# Patient Record
Sex: Female | Born: 1980 | Race: White | Hispanic: No | Marital: Single | Smoking: Former smoker
Health system: Southern US, Community
[De-identification: ages and names within clinical notes are randomized; demographics above are authoritative.]

## PROBLEM LIST (undated history)

## (undated) DIAGNOSIS — F419 Anxiety disorder, unspecified: Secondary | ICD-10-CM

## (undated) DIAGNOSIS — K5790 Diverticulosis of intestine, part unspecified, without perforation or abscess without bleeding: Secondary | ICD-10-CM

## (undated) DIAGNOSIS — K219 Gastro-esophageal reflux disease without esophagitis: Secondary | ICD-10-CM

## (undated) DIAGNOSIS — E785 Hyperlipidemia, unspecified: Secondary | ICD-10-CM

## (undated) DIAGNOSIS — F32A Depression, unspecified: Secondary | ICD-10-CM

## (undated) DIAGNOSIS — A498 Other bacterial infections of unspecified site: Secondary | ICD-10-CM

## (undated) DIAGNOSIS — G43909 Migraine, unspecified, not intractable, without status migrainosus: Secondary | ICD-10-CM

## (undated) DIAGNOSIS — A499 Bacterial infection, unspecified: Secondary | ICD-10-CM

## (undated) DIAGNOSIS — B958 Unspecified staphylococcus as the cause of diseases classified elsewhere: Secondary | ICD-10-CM

## (undated) DIAGNOSIS — J302 Other seasonal allergic rhinitis: Secondary | ICD-10-CM

## (undated) HISTORY — PX: LAPAROSCOPY: SHX197

## (undated) HISTORY — DX: Hyperlipidemia, unspecified: E78.5

## (undated) HISTORY — PX: TYMPANOSTOMY TUBE PLACEMENT: SHX32

## (undated) HISTORY — DX: Depression, unspecified: F32.A

## (undated) HISTORY — DX: Unspecified staphylococcus as the cause of diseases classified elsewhere: B95.8

## (undated) HISTORY — DX: Other bacterial infections of unspecified site: A49.8

## (undated) HISTORY — DX: Diverticulosis of intestine, part unspecified, without perforation or abscess without bleeding: K57.90

## (undated) HISTORY — PX: TONSILLECTOMY AND ADENOIDECTOMY: SHX28

## (undated) HISTORY — DX: Other seasonal allergic rhinitis: J30.2

## (undated) HISTORY — DX: Bacterial infection, unspecified: A49.9

## (undated) HISTORY — DX: Gastro-esophageal reflux disease without esophagitis: K21.9

## (undated) HISTORY — DX: Anxiety disorder, unspecified: F41.9

---

## 2006-07-01 HISTORY — PX: ABDOMINAL ADHESION SURGERY: SHX90

## 2016-11-27 ENCOUNTER — Encounter (HOSPITAL_COMMUNITY): Payer: Self-pay | Admitting: Emergency Medicine

## 2016-11-27 ENCOUNTER — Emergency Department (HOSPITAL_COMMUNITY): Payer: BLUE CROSS/BLUE SHIELD

## 2016-11-27 ENCOUNTER — Emergency Department (HOSPITAL_COMMUNITY)
Admission: EM | Admit: 2016-11-27 | Discharge: 2016-11-27 | Disposition: A | Discharge status: Home / Self Care | Payer: BLUE CROSS/BLUE SHIELD | Attending: Emergency Medicine | Admitting: Emergency Medicine

## 2016-11-27 DIAGNOSIS — Z79899 Other long term (current) drug therapy: Secondary | ICD-10-CM | POA: Diagnosis not present

## 2016-11-27 DIAGNOSIS — G43009 Migraine without aura, not intractable, without status migrainosus: Secondary | ICD-10-CM | POA: Diagnosis not present

## 2016-11-27 DIAGNOSIS — R51 Headache: Secondary | ICD-10-CM | POA: Diagnosis present

## 2016-11-27 DIAGNOSIS — J0110 Acute frontal sinusitis, unspecified: Secondary | ICD-10-CM | POA: Diagnosis not present

## 2016-11-27 HISTORY — DX: Migraine, unspecified, not intractable, without status migrainosus: G43.909

## 2016-11-27 MED ORDER — TRAMADOL HCL 50 MG PO TABS: 50.0000 mg | ORAL_TABLET | Freq: Four times a day (QID) | ORAL | 0 refills | Status: DC | PRN

## 2016-11-27 MED ORDER — AMOXICILLIN-POT CLAVULANATE 875-125 MG PO TABS: 1.0000 | ORAL_TABLET | Freq: Two times a day (BID) | ORAL | 0 refills | Status: DC

## 2016-11-27 MED ORDER — PROCHLORPERAZINE EDISYLATE 5 MG/ML IJ SOLN
10.0000 mg | Freq: Once | INTRAMUSCULAR | Status: AC
  Administered 2016-11-27: 10 mg via INTRAVENOUS
  Filled 2016-11-27: qty 2

## 2016-11-27 MED ORDER — KETOROLAC TROMETHAMINE 30 MG/ML IJ SOLN
30.0000 mg | Freq: Once | INTRAMUSCULAR | Status: AC
  Administered 2016-11-27: 30 mg via INTRAVENOUS
  Filled 2016-11-27: qty 1

## 2016-11-27 MED ORDER — FLUCONAZOLE 200 MG PO TABS: 200.0000 mg | ORAL_TABLET | Freq: Once | ORAL | 0 refills | Status: AC

## 2016-11-27 MED ORDER — DIPHENHYDRAMINE HCL 50 MG/ML IJ SOLN
25.0000 mg | Freq: Once | INTRAMUSCULAR | Status: AC
  Administered 2016-11-27: 25 mg via INTRAVENOUS
  Filled 2016-11-27: qty 1

## 2016-11-27 MED ORDER — SODIUM CHLORIDE 0.9 % IV BOLUS (SEPSIS)
1000.0000 mL | Freq: Once | INTRAVENOUS | Status: AC
  Administered 2016-11-27: 1000 mL via INTRAVENOUS

## 2016-11-27 MED ORDER — GUAIFENESIN ER 1200 MG PO TB12: 1.0000 | ORAL_TABLET | Freq: Two times a day (BID) | ORAL | 0 refills | Status: DC

## 2016-11-27 MED ORDER — IBUPROFEN 800 MG PO TABS: 800.0000 mg | ORAL_TABLET | Freq: Three times a day (TID) | ORAL | 0 refills | Status: DC | PRN

## 2016-11-27 NOTE — ED Notes (Signed)
Patient transported to CT 

## 2016-11-27 NOTE — ED Triage Notes (Signed)
Patient reports worsening migraine headache with photophobia and mild nausea onset this week unrelieved by OTC Exedrin , denies fever or chills , no blurred vision .

## 2016-11-27 NOTE — Discharge Instructions (Signed)
Return here as needed.  Follow-up with your Primary doctor, increase your fluid intake and rest as much as possible.

## 2016-12-01 NOTE — ED Provider Notes (Signed)
[facility]-EMERGENCY DEPT Provider Note   CSN: [id number] Arrival date & time: 11/27/16  0510     History   Chief Complaint Chief Complaint  Patient presents with  . Migraine    HPI Rita Nelson is a 36 y.o. female.  HPI Patient presents to the emergency department with headache that started 2 days ago.  Patient states that last night the pain got worse in her head.  She states that she is also having some nasal symptoms as well.  The patient states nothing seems to make the condition better or worse.  She states she did try some over-the-counter medications without relief of her symptoms. The patient denies chest pain, shortness of breath, blurred vision, neck pain, fever, cough, weakness, numbness, dizziness, anorexia, edema, abdominal pain, nausea, vomiting, diarrhea, rash, back pain, dysuria, hematemesis, bloody stool, near syncope, or syncope. Past Medical History:  Diagnosis Date  . Migraine     There are no active problems to display for this patient.   Past Surgical History:  Procedure Laterality Date  . CESAREAN SECTION    . LAPAROSCOPY      OB History    No data available       Home Medications    Prior to Admission medications   Medication Sig Start Date End Date Taking? Authorizing Provider  amoxicillin-clavulanate (AUGMENTIN) 875-125 MG tablet Take 1 tablet by mouth every 12 (twelve) hours. 11/27/16   Lessly Stigler, Cristal Deerhristopher, PA-C  Guaifenesin 1200 MG TB12 Take 1 tablet (1,200 mg total) by mouth 2 (two) times daily. 11/27/16   Grisela Mesch, Cristal Deerhristopher, PA-C  ibuprofen (ADVIL,MOTRIN) 800 MG tablet Take 1 tablet (800 mg total) by mouth every 8 (eight) hours as needed. 11/27/16   Jenesa Foresta, Cristal Deerhristopher, PA-C  traMADol (ULTRAM) 50 MG tablet Take 1 tablet (50 mg total) by mouth every 6 (six) hours as needed for severe pain. 11/27/16   Charlestine NightLawyer, Roxana Lai, PA-C    Family History No family history on file.  Social History Social History  Substance Use Topics  . Smoking  status: Never Smoker  . Smokeless tobacco: Never Used  . Alcohol use Yes     Allergies   Sulfa antibiotics   Review of Systems Review of Systems All other systems negative except as documented in the HPI. All pertinent positives and negatives as reviewed in the HPI.  Physical Exam Updated Vital Signs BP 118/69   Pulse 81   Temp 98.4 F (36.9 C) (Oral)   Resp 18   LMP 10/05/2016 (Approximate)   SpO2 99%   Physical Exam  Constitutional: She is oriented to person, place, and time. She appears well-developed and well-nourished. No distress.  HENT:  Head: Normocephalic and atraumatic.  Mouth/Throat: Oropharynx is clear and moist.  Eyes: Pupils are equal, round, and reactive to light.  Neck: Normal range of motion. Neck supple.  Cardiovascular: Normal rate, regular rhythm and normal heart sounds.  Exam reveals no gallop and no friction rub.   No murmur heard. Pulmonary/Chest: Effort normal and breath sounds normal. No respiratory distress. She has no wheezes.  Abdominal: Soft. Bowel sounds are normal. She exhibits no distension. There is no tenderness.  Neurological: She is alert and oriented to person, place, and time. She exhibits normal muscle tone. Coordination normal.  Skin: Skin is warm and dry. No rash noted. No erythema.  Psychiatric: She has a normal mood and affect. Her behavior is normal.  Nursing note and vitals reviewed.    ED Treatments / Results  Labs (all labs  ordered are listed, but only abnormal results are displayed) Labs Reviewed - No data to display  EKG  EKG Interpretation None       Radiology No results found.  Procedures Procedures (including critical care time)  Medications Ordered in ED Medications  sodium chloride 0.9 % bolus 1,000 mL (0 mLs Intravenous Stopped 11/27/16 1157)  ketorolac (TORADOL) 30 MG/ML injection 30 mg (30 mg Intravenous Given 11/27/16 0846)  prochlorperazine (COMPAZINE) injection 10 mg (10 mg Intravenous Given  11/27/16 0847)  diphenhydrAMINE (BENADRYL) injection 25 mg (25 mg Intravenous Given 11/27/16 0845)     Initial Impression / Assessment and Plan / ED Course  I have reviewed the triage vital signs and the nursing notes.  Pertinent labs & imaging results that were available during my care of the patient were reviewed by me and considered in my medical decision making (see chart for details).     Patient had CT scan due to the fact that she had worsening headache that seems different from her normal headaches.  The patient has what appears to be a migraine headache along with sinusitis.  Patient is feeling better following IV medications.  Told to return here as needed.  Patient agrees the plan and all questions were answered  Final Clinical Impressions(s) / ED Diagnoses   Final diagnoses:  Migraine without aura and without status migrainosus, not intractable  Acute non-recurrent frontal sinusitis    New Prescriptions Discharge Medication List as of 11/27/2016 11:39 AM    START taking these medications   Details  amoxicillin-clavulanate (AUGMENTIN) 875-125 MG tablet Take 1 tablet by mouth every 12 (twelve) hours., Starting Wed 11/27/2016, Print    Guaifenesin 1200 MG TB12 Take 1 tablet (1,200 mg total) by mouth 2 (two) times daily., Starting Wed 11/27/2016, Print    ibuprofen (ADVIL,MOTRIN) 800 MG tablet Take 1 tablet (800 mg total) by mouth every 8 (eight) hours as needed., Starting Wed 11/27/2016, Print    traMADol (ULTRAM) 50 MG tablet Take 1 tablet (50 mg total) by mouth every 6 (six) hours as needed for severe pain., Starting Wed 11/27/2016, [employer], [place], PA-C 12/01/16 0981    Pricilla Loveless, MD 12/05/16 [phone]

## 2018-05-05 ENCOUNTER — Encounter: Payer: Self-pay | Admitting: Allergy & Immunology

## 2018-05-05 ENCOUNTER — Ambulatory Visit (INDEPENDENT_AMBULATORY_CARE_PROVIDER_SITE_OTHER): Payer: BLUE CROSS/BLUE SHIELD | Admitting: Allergy & Immunology

## 2018-05-05 VITALS — BP 132/94 | HR 86 | Temp 98.2°F | Resp 20 | Ht 65.0 in | Wt 256.6 lb

## 2018-05-05 DIAGNOSIS — J3089 Other allergic rhinitis: Secondary | ICD-10-CM

## 2018-05-05 DIAGNOSIS — T781XXD Other adverse food reactions, not elsewhere classified, subsequent encounter: Secondary | ICD-10-CM

## 2018-05-05 MED ORDER — FLUTICASONE PROPIONATE 93 MCG/ACT NA EXHU: 2.0000 | INHALANT_SUSPENSION | Freq: Every day | NASAL | 3 refills | Status: DC

## 2018-05-05 MED ORDER — BEPOTASTINE BESILATE 1.5 % OP SOLN: 2.0000 [drp] | Freq: Two times a day (BID) | OPHTHALMIC | 3 refills | Status: DC

## 2018-05-05 NOTE — Patient Instructions (Addendum)
1. Chronic rhinitis - Testing today showed: cat - Avoidance measures provided. - we will get blood work to confirm this. - We will also get the records from your previous testing.  - Start the prednisone pack provided to help with symptom relief.  - Stop taking: Nasacort and olopatadine eye drops - Continue with: Xyzal (levocetirizine) 5mg  tablet once daily, Singulair (montelukast) 10mg  daily and Astelin (azelastine) 2 sprays per nostril 1-2 times daily as needed - Start taking: Xhance (fluticasone) 1-2 sprays per nostril twice daily and Bepreve (bepotastine) one drop per eye twice daily as needed)  - We will send in the script to the [facility] outpatient pharmacy, and they will call you to confirm your shipping address. - You can review how to use the device here: https://www.xhance.com - Ask to be enrolled in the auto-refill program so you can get a year for free. - You can use an extra dose of the antihistamine, if needed, for breakthrough symptoms.  - Consider nasal saline rinses 1-2 times daily to remove allergens from the nasal cavities as well as help with mucous clearance (this is especially helpful to do before the nasal sprays are given) - Consider allergy shots as a means of long-term control. - Allergy shots "re-train" and "reset" the immune system to ignore environmental allergens and decrease the resulting immune response to those allergens (sneezing, itchy watery eyes, runny nose, nasal congestion, etc).    - Allergy shots improve symptoms in 75-85% of patients.  - However we will get more information before pursuing this.   2. Adverse food reaction - milk - There is no testing needed for milk since you are tolerating cheese without any problems. - The same allergenic protein in cow's milk is present in cheese, therefore you are not allergic.  3. Return in about 3 months (around 08/05/2018).   Please inform us of any Emergency Department visits, hospitalizations, or changes in  symptoms. Call us before going to the ED for breathing or allergy symptoms since we might be able to fit you in for a sick visit. Feel free to contact us anytime with any questions, problems, or concerns.  It was a pleasure to meet you today!  Websites that have reliable patient information: 1. American Academy of Asthma, Allergy, and Immunology: www.aaaai.org 2. Food Allergy Research and Education (FARE): foodallergy.org 3. Mothers of Asthmatics: http://www.asthmacommunitynetwork.org 4. American College of Allergy, Asthma, and Immunology: MissingWeapons.ca   Make sure you are registered to vote! If you have moved or changed any of your contact information, you will need to get this updated before voting!     Control of Dog or Cat Allergen  Avoidance is the best way to manage a dog or cat allergy. If you have a dog or cat and are allergic to dog or cats, consider removing the dog or cat from the home. If you have a dog or cat but don't want to find it a new home, or if your family wants a pet even though someone in the household is allergic, here are some strategies that may help keep symptoms at bay:  1. Keep the pet out of your bedroom and restrict it to only a few rooms. Be advised that keeping the dog or cat in only one room will not limit the allergens to that room. 2. Don't pet, hug or kiss the dog or cat; if you do, wash your hands with soap and water. 3. High-efficiency particulate air (HEPA) cleaners run continuously in a bedroom  or living room can reduce allergen levels over time. 4. Regular use of a high-efficiency vacuum cleaner or a central vacuum can reduce allergen levels. 5. Giving your dog or cat a bath at least once a week can reduce airborne allergen.  Allergy Shots   Allergies are the result of a chain reaction that starts in the immune system. Your immune system controls how your body defends itself. For instance, if you have an allergy to pollen, your immune system  identifies pollen as an invader or allergen. Your immune system overreacts by producing antibodies called Immunoglobulin E (IgE). These antibodies travel to cells that release chemicals, causing an allergic reaction.  The concept behind allergy immunotherapy, whether it is received in the form of shots or tablets, is that the immune system can be desensitized to specific allergens that trigger allergy symptoms. Although it requires time and patience, the payback can be long-term relief.  How Do Allergy Shots Work?  Allergy shots work much like a vaccine. Your body responds to injected amounts of a particular allergen given in increasing doses, eventually developing a resistance and tolerance to it. Allergy shots can lead to decreased, minimal or no allergy symptoms.  There generally are two phases: build-up and maintenance. Build-up often ranges from three to six months and involves receiving injections with increasing amounts of the allergens. The shots are typically given once or twice a week, though more rapid build-up schedules are sometimes used.  The maintenance phase begins when the most effective dose is reached. This dose is different for each person, depending on how allergic you are and your response to the build-up injections. Once the maintenance dose is reached, there are longer periods between injections, typically two to four weeks.  Occasionally doctors give cortisone-type shots that can temporarily reduce allergy symptoms. These types of shots are different and should not be confused with allergy immunotherapy shots.  Who Can Be Treated with Allergy Shots?  Allergy shots may be a good treatment approach for people with allergic rhinitis (hay fever), allergic asthma, conjunctivitis (eye allergy) or stinging insect allergy.   Before deciding to begin allergy shots, you should consider:  . The length of allergy season and the severity of your symptoms . Whether medications and/or  changes to your environment can control your symptoms . Your desire to avoid long-term medication use . Time: allergy immunotherapy requires a major time commitment . Cost: may vary depending on your insurance coverage  Allergy shots for children age 72 and older are effective and often well tolerated. They might prevent the onset of new allergen sensitivities or the progression to asthma.  Allergy shots are not started on patients who are pregnant but can be continued on patients who become pregnant while receiving them. In some patients with other medical conditions or who take certain common medications, allergy shots may be of risk. It is important to mention other medications you talk to your allergist.   When Will I Feel Better?  Some may experience decreased allergy symptoms during the build-up phase. For others, it may take as long as 12 months on the maintenance dose. If there is no improvement after a year of maintenance, your allergist will discuss other treatment options with you.  If you aren't responding to allergy shots, it may be because there is not enough dose of the allergen in your vaccine or there are missing allergens that were not identified during your allergy testing. Other reasons could be that there are high levels  of the allergen in your environment or major exposure to non-allergic triggers like tobacco smoke.  What Is the Length of Treatment?  Once the maintenance dose is reached, allergy shots are generally continued for three to five years. The decision to stop should be discussed with your allergist at that time. Some people may experience a permanent reduction of allergy symptoms. Others may relapse and a longer course of allergy shots can be considered.  What Are the Possible Reactions?  The two types of adverse reactions that can occur with allergy shots are local and systemic. Common local reactions include very mild redness and swelling at the injection  site, which can happen immediately or several hours after. A systemic reaction, which is less common, affects the entire body or a particular body system. They are usually mild and typically respond quickly to medications. Signs include increased allergy symptoms such as sneezing, a stuffy nose or hives.  Rarely, a serious systemic reaction called anaphylaxis can develop. Symptoms include swelling in the throat, wheezing, a feeling of tightness in the chest, nausea or dizziness. Most serious systemic reactions develop within 30 minutes of allergy shots. This is why it is strongly recommended you wait in your doctor's office for 30 minutes after your injections. Your allergist is trained to watch for reactions, and his or her staff is trained and equipped with the proper medications to identify and treat them.  Who Should Administer Allergy Shots?  The preferred location for receiving shots is your prescribing allergist's office. Injections can sometimes be given at another facility where the physician and staff are trained to recognize and treat reactions, and have received instructions by your prescribing allergist.

## 2018-05-05 NOTE — Progress Notes (Signed)
NEW PATIENT  Date of Service/Encounter:  05/05/18  Referring provider: Patient, No Pcp Per   Assessment:   Perennial rhinitis (cats) - s/p 9 months of immunotherapy in 2016-2017  Adverse food reaction (milk) - with clinical tolerance  Plan/Recommendations:   1. Chronic rhinitis - Testing today showed: cat - Avoidance measures provided. - we will get blood work to confirm this. - We will also get the records from your previous testing.  - Start the prednisone pack provided to help with symptom relief.  - Stop taking: Nasacort and olopatadine eye drops - Continue with: Xyzal (levocetirizine) 5mg  tablet once daily, Singulair (montelukast) 10mg  daily and Astelin (azelastine) 2 sprays per nostril 1-2 times daily as needed - Start taking: Xhance (fluticasone) 1-2 sprays per nostril twice daily and Bepreve (bepotastine) one drop per eye twice daily as needed)  - We will send in the script to the [facility] outpatient pharmacy, and they will call you to confirm your shipping address. - You can review how to use the device here: https://www.xhance.com - Ask to be enrolled in the auto-refill program so you can get a year for free. - You can use an extra dose of the antihistamine, if needed, for breakthrough symptoms.  - Consider nasal saline rinses 1-2 times daily to remove allergens from the nasal cavities as well as help with mucous clearance (this is especially helpful to do before the nasal sprays are given) - Consider allergy shots as a means of long-term control. - Allergy shots "re-train" and "reset" the immune system to ignore environmental allergens and decrease the resulting immune response to those allergens (sneezing, itchy watery eyes, runny nose, nasal congestion, etc).    - Allergy shots improve symptoms in 75-85% of patients.  - However we will get more information before pursuing this.   2. Adverse food reaction - milk - There is no testing needed for milk since you are  tolerating cheese without any problems. - The same allergenic protein in cow's milk is present in cheese, therefore you are not allergic.  3. Return in about 3 months (around 08/05/2018).  Subjective:   Rita Nelson is a 37 y.o. female presenting today for evaluation of  Chief Complaint  Patient presents with  . Nasal Congestion  . Itchy Eye    Rita Nelson has a history of the following: Patient Active Problem List   Diagnosis Date Noted  . Perennial allergic rhinitis 05/05/2018    History obtained from: chart review and patient.  Rita Nelson was referred by Patient, No Pcp Per.     Rita Nelson is a 37 y.o. female presenting for evaluation of allergic rhinitis.  She moved from [place] in 2017.  She wanted a change in her lifestyle. She was on shots for 8 months prior to this. She got to the last two vials and she was having large local reactions. She was giving them to herself. Allergy shots were ordered from [facility] through her PCP's office. She did it every other day three times weekly. She was on it in total for 8-9 months.   She was evaluated by Dr. Jenne Pane in May 2019.  She has a long-standing history of allergic rhinitis and sinonasal symptoms.  She endorses itchy watery eyes, sneezing, and clear mucus drainage.  She gets sinus infections around 3-4 times per year.  Dr. Jenne Pane evaluated her and felt that her local picture was more consistent with allergy.  She was restarted on Singulair, Pataday, and Astelin. She was  on Nasacort as well. She does use Sudafed as needed. She is on Xyzal; she has tried all of the others without improvement in her symptoms.   She is currently on antihistamines and nasal steroids.  She has been on Singulair for several years.  She also notes that symptoms have worsened since January.  She is on over-the-counter eyedrops. She knows that cats and oak are triggers for her. She also thinks that she was allergic to dust mite. She did have a sinus  CT that demonstrated pansinusitis as well as left septal deviation.    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 She is with the patient Sunday through Thursday overnight.  She does report that she had a milk allergy when she was younger. She was always on soy milk. She does eat a lot of cheese. She does tolerate yogurt and ice cream without a problem. She does have chronic itching with some intermittent red spots.    Head CT (May 2018) Sinuses/Orbits: There are retention cysts in each maxillary antrum, more notable on the right than on the left. There is mild mucosal thickening in several ethmoid air cells bilaterally. Other visualized paranasal sinuses are clear. There is a concha bullosa on the right, an anatomic variant. There is leftward deviation of the nasal septum. Orbits appear symmetric bilaterally.  Otherwise, there is no history of other atopic diseases, including asthma, drug allergies, stinging insect allergies, eczema or urticaria. There is no significant infectious history. Vaccinations are up to date.    Past Medical History: Patient Active Problem List   Diagnosis Date Noted  . Perennial allergic rhinitis 05/05/2018    Medication List:  Allergies as of 05/05/2018      Reactions   Sulfa Antibiotics       Medication List        Accurate as of 05/05/18  1:05 PM. Always use your most recent med list.          azelastine 0.1 % nasal spray Commonly known as:  ASTELIN Place into the nose.   Bepotastine Besilate 1.5 % Soln Place 2 drops into both eyes 2 (two) times daily.   buPROPion 300 MG 24 hr tablet Commonly known as:  WELLBUTRIN XL TK 1 T PO IN THE MORNING   diphenhydrAMINE 25 MG tablet Commonly known as:  BENADRYL Take by mouth.   Fluticasone Propionate 93 MCG/ACT Exhu Place 2 sprays into the nose daily.   ibuprofen 800 MG tablet Commonly known as:  ADVIL,MOTRIN Take 1 tablet (800 mg  total) by mouth every 8 (eight) hours as needed.   JUNEL FE 24 1-20 MG-MCG(24) tablet Generic drug:  Norethindrone Acetate-Ethinyl Estrad-FE   levocetirizine 5 MG tablet Commonly known as:  XYZAL TK 1 T PO HS   montelukast 10 MG tablet Commonly known as:  SINGULAIR   Olopatadine HCl 0.2 % Soln Apply to eye.   omeprazole 20 MG capsule Commonly known as:  PRILOSEC TK 1 C PO D   Propylene Glycol 0.6 % Soln 1 drop.   triamcinolone 55 MCG/ACT Aero nasal inhaler Commonly known as:  NASACORT Place into the nose.       Birth History: non-contributory  Developmental History: non-contributory.   Past Surgical History: Past Surgical History:  Procedure Laterality Date  . CESAREAN SECTION    . LAPAROSCOPY       Family History: Family History  Problem Relation Age of  Onset  . Hypertension Father   . Diabetes Father      Social History: Murle lives at home with her two daughters (ages 97 and 41) as well as her sister and brother-in-law. There is a dog in the home. 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 Her work environment is dusty and has cats, which definitely trigger her symptoms. .     Review of Systems: a 14-point review of systems is pertinent for what is mentioned in HPI.  Otherwise, all other systems were negative. Constitutional: negative other than that listed in the HPI Eyes: negative other than that listed in the HPI Ears, nose, mouth, throat, and face: negative other than that listed in the HPI Respiratory: negative other than that listed in the HPI Cardiovascular: negative other than that listed in the HPI Gastrointestinal: negative other than that listed in the HPI Genitourinary: negative other than that listed in the HPI Integument: negative other than that listed in the HPI Hematologic: negative other than that listed in the HPI Musculoskeletal: negative other than that listed in the  HPI Neurological: negative other than that listed in the HPI Allergy/Immunologic: negative other than that listed in the HPI    Objective:   Blood pressure (!) 132/94, pulse 86, temperature 98.2 F (36.8 C), temperature source Oral, resp. rate 20, height 5\' 5"  (1.651 m), weight 256 lb 9.6 oz (116.4 kg), SpO2 97 %. Body mass index is 42.7 kg/m.   Physical Exam:  General: Alert, interactive, in no acute distress. Pleasant and interactive.  Eyes: No conjunctival injection bilaterally, no discharge on the right, no discharge on the left and no Horner-Trantas dots present. PERRL bilaterally. EOMI without pain. No photophobia.  Ears: Right TM pearly gray with normal light reflex, Left TM pearly gray with normal light reflex, Right TM intact without perforation and Left TM intact without perforation.  Nose/Throat: External nose within normal limits and septum midline. Turbinates edematous and pale with clear discharge. Posterior oropharynx erythematous with cobblestoning in the posterior oropharynx. Tonsils 2+ without exudates.  Tongue without thrush. Neck: Supple without thyromegaly. Trachea midline. Adenopathy: shoddy bilateral anterior cervical lymphadenopathy and no enlarged lymph nodes appreciated in the occipital, axillary, epitrochlear, inguinal, or popliteal regions. Lungs: Clear to auscultation without wheezing, rhonchi or rales. No increased work of breathing. CV: Normal S1/S2. No murmurs. Capillary refill <2 seconds.  Abdomen: Nondistended, nontender. No guarding or rebound tenderness. Bowel sounds present in all fields and hypoactive  Skin: Warm and dry, without lesions or rashes. There are some excoriations present on the bilateral arms.  Extremities:  No clubbing, cyanosis or edema. Neuro:   Grossly intact. No focal deficits appreciated. Responsive to questions.  Diagnostic studies:     Allergy Studies:   Airborne Adult Perc - 05-14-18 0900    Time Antigen Placed  0900     Allergen Manufacturer  Waynette Buttery    Location  Back    Number of Test  59    Panel 1  Select    1. Control-Buffer 50% Glycerol  Negative    2. Control-Histamine 1 mg/ml  Negative    3. Albumin saline  Negative    4. Bahia  Negative    5. French Southern Territories  Negative    6. Johnson  Negative    7. Kentucky Blue  Negative    8. Meadow Fescue  Negative    9. Perennial Rye  Negative    10.  Sweet Vernal  Negative    11. Timothy  Negative    12. Cocklebur  Negative    13. Burweed Marshelder  Negative    14. Ragweed, short  Negative    15. Ragweed, Giant  Negative    16. Plantain,  English  Negative    17. Lamb's Quarters  Negative    18. Sheep Sorrell  Negative    19. Rough Pigweed  Negative    20. Marsh Elder, Rough  Negative    21. Mugwort, Common  Negative    22. Ash mix  Negative    23. Birch mix  Negative    24. Beech American  Negative    25. Box, Elder  Negative    26. Cedar, red  Negative    27. Cottonwood, Guinea-Bissau  Negative    28. Elm mix  Negative    29. Hickory mix  Negative    30. Maple mix  Negative    31. Oak, Guinea-Bissau mix  Negative    32. Pecan Pollen  Negative    33. Pine mix  Negative    34. Sycamore Eastern  Negative    35. Walnut, Black Pollen  Negative    36. Alternaria alternata  Negative    37. Cladosporium Herbarum  Negative    38. Aspergillus mix  Negative    39. Penicillium mix  Negative    40. Bipolaris sorokiniana (Helminthosporium)  Negative    41. Drechslera spicifera (Curvularia)  Negative    42. Mucor plumbeus  Negative    43. Fusarium moniliforme  Negative    44. Aureobasidium pullulans (pullulara)  Negative    45. Rhizopus oryzae  Negative    46. Botrytis cinera  Negative    47. Epicoccum nigrum  Negative    48. Phoma betae  Negative    49. Candida Albicans  Negative    50. Trichophyton mentagrophytes  Negative    51. Mite, D Farinae  5,000 AU/ml  Negative    52. Mite, D Pteronyssinus  5,000 AU/ml  Negative    53. Cat Hair 10,000 BAU/ml  3+    54.   Dog Epithelia  Negative    55. Mixed Feathers  Negative    56. Horse Epithelia  Negative    57. Cockroach, German  Negative    58. Mouse  Negative    59. Tobacco Leaf  Negative     Intradermal - 05/05/18 0930    Time Antigen Placed  0930    Allergen Manufacturer  Waynette Buttery    Location  Arm    Number of Test  14    Intradermal  Select    Control  Negative    French Southern Territories  Negative    Johnson  Negative    7 Grass  Negative    Ragweed mix  Negative    Weed mix  Negative    Tree mix  Negative    Mold 1  Negative    Mold 2  Negative    Mold 3  Negative    Mold 4  Negative    Dog  Negative    Cockroach  Negative    Mite mix  Negative        Allergy testing results were read and interpreted by myself, documented by clinical staff.       Malachi Bonds, MD [facility]

## 2018-05-06 ENCOUNTER — Telehealth: Payer: Self-pay | Admitting: Allergy & Immunology

## 2018-05-06 MED ORDER — OLOPATADINE HCL 0.1 % OP SOLN: 1.0000 [drp] | Freq: Two times a day (BID) | OPHTHALMIC | 12 refills | Status: DC | PRN

## 2018-05-06 NOTE — Telephone Encounter (Signed)
Send in Patanol one drop per eye BID PRN. Please call the patient to let her know.  Malachi Bonds, MD [facility]

## 2018-05-06 NOTE — Telephone Encounter (Signed)
Dr. Gallagher, please advise. 

## 2018-05-06 NOTE — Telephone Encounter (Signed)
Spoke with patient. She is aware of medication change.

## 2018-05-06 NOTE — Addendum Note (Signed)
Addended by: Valarie Cones on: 05/06/2018 10:50 AM   Modules accepted: Orders

## 2018-05-06 NOTE — Telephone Encounter (Signed)
Pt called and said when to pharmacy and the pataday is too high and needs to have something called into [facility] on [address]. [phone].

## 2018-05-08 LAB — IGE+ALLERGENS ZONE 2(30)
Aspergillus Fumigatus IgE: <0.1 kU/L
Cockroach, American IgE: <0.1 kU/L
Common Silver Birch IgE: <0.1 kU/L
E001-IGE CAT DANDER: 0.36 kU/L — AB
E005-IGE DOG DANDER: 0.53 kU/L — AB
Elm, American IgE: <0.1 kU/L
Hickory, White IgE: <0.1 kU/L
IgE (Immunoglobulin E), Serum: 14 IU/mL (ref 6–495)
Johnson Grass IgE: <0.1 kU/L
Maple/Box Elder IgE: <0.1 kU/L
Mugwort IgE Qn: <0.1 kU/L
Nettle IgE: <0.1 kU/L
Oak, White IgE: <0.1 kU/L
Penicillium Chrysogen IgE: <0.1 kU/L
Pigweed, Rough IgE: <0.1 kU/L
Plantain, English IgE: <0.1 kU/L
Sheep Sorrel IgE Qn: <0.1 kU/L
White Mulberry IgE: <0.1 kU/L

## 2018-05-22 ENCOUNTER — Telehealth: Payer: Self-pay

## 2018-05-22 NOTE — Telephone Encounter (Signed)
Spoke to pt she stated she spoke to [facility] and she got a denial stating she needed to try to other nasal sprays. I am calling to initiate a prior authorization and she [place].  Prior Auth for [place] [phone]

## 2018-05-26 NOTE — Telephone Encounter (Signed)
Prior authorization form faxed to the patient's insurance plan. The form and notes have been placed in the pending box in [place] nurse's station.

## 2019-06-05 ENCOUNTER — Other Ambulatory Visit: Payer: Self-pay

## 2019-06-05 ENCOUNTER — Emergency Department (HOSPITAL_COMMUNITY): Payer: BLUE CROSS/BLUE SHIELD

## 2019-06-05 ENCOUNTER — Emergency Department (HOSPITAL_COMMUNITY)
Admission: EM | Admit: 2019-06-05 | Discharge: 2019-06-06 | Disposition: A | Discharge status: Home / Self Care | Payer: BLUE CROSS/BLUE SHIELD | Attending: Emergency Medicine | Admitting: Emergency Medicine

## 2019-06-05 DIAGNOSIS — Z79899 Other long term (current) drug therapy: Secondary | ICD-10-CM | POA: Insufficient documentation

## 2019-06-05 DIAGNOSIS — R197 Diarrhea, unspecified: Secondary | ICD-10-CM | POA: Insufficient documentation

## 2019-06-05 DIAGNOSIS — Z87891 Personal history of nicotine dependence: Secondary | ICD-10-CM | POA: Diagnosis not present

## 2019-06-05 DIAGNOSIS — R509 Fever, unspecified: Secondary | ICD-10-CM | POA: Insufficient documentation

## 2019-06-05 DIAGNOSIS — U071 COVID-19: Secondary | ICD-10-CM | POA: Diagnosis not present

## 2019-06-05 DIAGNOSIS — R0789 Other chest pain: Secondary | ICD-10-CM | POA: Insufficient documentation

## 2019-06-05 DIAGNOSIS — R0602 Shortness of breath: Secondary | ICD-10-CM

## 2019-06-05 MED ORDER — ACETAMINOPHEN 325 MG PO TABS
650.0000 mg | ORAL_TABLET | Freq: Once | ORAL | Status: AC | PRN
  Administered 2019-06-05: 650 mg via ORAL
  Filled 2019-06-05: qty 2

## 2019-06-05 MED ORDER — ONDANSETRON 4 MG PO TBDP
4.0000 mg | ORAL_TABLET | Freq: Once | ORAL | Status: AC | PRN
  Administered 2019-06-05: 4 mg via ORAL
  Filled 2019-06-05: qty 1

## 2019-06-05 NOTE — ED Triage Notes (Addendum)
Pt was dx with COVID one week ago. She tested herself one week ago while she was working. C/o a fever, and SOB. NAD noted. Patient states that she had to work tomorrow and is not feeling well enough to go.

## 2019-06-06 ENCOUNTER — Emergency Department (HOSPITAL_COMMUNITY): Payer: BLUE CROSS/BLUE SHIELD

## 2019-06-06 LAB — BASIC METABOLIC PANEL
Anion gap: 13 (ref 5–15)
BUN: 8 mg/dL (ref 6–20)
CO2: 21 mmol/L — ABNORMAL LOW (ref 22–32)
Calcium: 9 mg/dL (ref 8.9–10.3)
Chloride: 101 mmol/L (ref 98–111)
Creatinine, Ser: 0.8 mg/dL (ref 0.44–1.00)
GFR calc Af Amer: >60 mL/min (ref >60)
GFR calc non Af Amer: >60 mL/min (ref >60)
Glucose, Bld: 120 mg/dL — ABNORMAL HIGH (ref 70–99)
Potassium: 3 mmol/L — ABNORMAL LOW (ref 3.5–5.1)
Sodium: 135 mmol/L (ref 135–145)

## 2019-06-06 LAB — CBC WITH DIFFERENTIAL/PLATELET
Abs Immature Granulocytes: 0.01 10*3/uL (ref 0.00–0.07)
Basophils Absolute: 0 10*3/uL (ref 0.0–0.1)
Basophils Relative: 0 %
Eosinophils Absolute: 0 10*3/uL (ref 0.0–0.5)
Eosinophils Relative: 1 %
HCT: 37 % (ref 36.0–46.0)
Hemoglobin: 12.3 g/dL (ref 12.0–15.0)
Immature Granulocytes: 0 %
Lymphocytes Relative: 35 %
Lymphs Abs: 1.2 10*3/uL (ref 0.7–4.0)
MCH: 27.9 pg (ref 26.0–34.0)
MCHC: 33.2 g/dL (ref 30.0–36.0)
MCV: 83.9 fL (ref 80.0–100.0)
Monocytes Absolute: 0.4 10*3/uL (ref 0.1–1.0)
Monocytes Relative: 10 %
Neutro Abs: 1.9 10*3/uL (ref 1.7–7.7)
Neutrophils Relative %: 54 %
Platelets: 214 10*3/uL (ref 150–400)
RBC: 4.41 MIL/uL (ref 3.87–5.11)
RDW: 12.2 % (ref 11.5–15.5)
WBC: 3.5 10*3/uL — ABNORMAL LOW (ref 4.0–10.5)
nRBC: 0 % (ref 0.0–0.2)

## 2019-06-06 LAB — SARS CORONAVIRUS 2 (TAT 6-24 HRS): SARS Coronavirus 2: POSITIVE — AB

## 2019-06-06 LAB — POC SARS CORONAVIRUS 2 AG -  ED: SARS Coronavirus 2 Ag: NEGATIVE

## 2019-06-06 MED ORDER — AEROCHAMBER PLUS FLO-VU MEDIUM MISC
1.0000 | Freq: Once | Status: DC
  Filled 2019-06-06: qty 1

## 2019-06-06 MED ORDER — POTASSIUM CHLORIDE CRYS ER 20 MEQ PO TBCR
40.0000 meq | EXTENDED_RELEASE_TABLET | Freq: Once | ORAL | Status: AC
  Administered 2019-06-06: 40 meq via ORAL
  Filled 2019-06-06: qty 2

## 2019-06-06 MED ORDER — PROMETHAZINE-DM 6.25-15 MG/5ML PO SYRP: 5.0000 mL | ORAL_SOLUTION | Freq: Four times a day (QID) | ORAL | 0 refills | Status: DC | PRN

## 2019-06-06 MED ORDER — ALBUTEROL SULFATE HFA 108 (90 BASE) MCG/ACT IN AERS
2.0000 | INHALATION_SPRAY | RESPIRATORY_TRACT | Status: DC | PRN
  Administered 2019-06-06: 2 via RESPIRATORY_TRACT
  Filled 2019-06-06: qty 6.7

## 2019-06-06 MED ORDER — IBUPROFEN 400 MG PO TABS
400.0000 mg | ORAL_TABLET | Freq: Once | ORAL | Status: AC
  Administered 2019-06-06: 400 mg via ORAL
  Filled 2019-06-06: qty 1

## 2019-06-06 MED ORDER — PREDNISONE 50 MG PO TABS: 50.0000 mg | ORAL_TABLET | Freq: Every day | ORAL | 0 refills | Status: DC

## 2019-06-06 MED ORDER — GUAIFENESIN ER 1200 MG PO TB12: 1.0000 | ORAL_TABLET | Freq: Two times a day (BID) | ORAL | 0 refills | Status: DC

## 2019-06-06 MED ORDER — IOHEXOL 350 MG/ML SOLN
100.0000 mL | Freq: Once | INTRAVENOUS | Status: AC | PRN
  Administered 2019-06-06: 61 mL via INTRAVENOUS

## 2019-06-06 NOTE — ED Notes (Signed)
Patient verbalizes understanding of discharge instructions. Opportunity for questioning and answers were provided. Armband removed by staff, pt discharged from ED. Ambulated out to lobby  

## 2019-06-06 NOTE — Discharge Instructions (Addendum)
Your repeat imaging did not show any change that would make [place] believe there is a blood clot.  There was some abnormality that could be related to an infectious type source.

## 2019-06-06 NOTE — ED Provider Notes (Signed)
[facility] EMERGENCY DEPARTMENT Provider Note   CSN: [id number] Arrival date & time: 06/05/19  2155     History   Chief Complaint Chief Complaint  Patient presents with  . Shortness of Breath    HPI Rita Nelson is a 38 y.o. female.     Patient presents to the emergency department with a chief complaint of shortness of breath chest pain.  She states that she took a coronavirus test at her work approximately 1 week ago.  She states that she tested positive.  States that she did not feel well enough to return to work.  She reports running a fever for the past 3 days.  She has had some diarrhea.  She states that she has central chest pain.  She states that the pain also radiates around her lower ribs and is worsened when she takes a deep breath.  The history is provided by the patient. No language interpreter was used.    Past Medical History:  Diagnosis Date  . Migraine     Patient Active Problem List   Diagnosis Date Noted  . Perennial allergic rhinitis 05/05/2018    Past Surgical History:  Procedure Laterality Date  . CESAREAN SECTION    . LAPAROSCOPY       OB History   No obstetric history on file.      Home Medications    Prior to Admission medications   Medication Sig Start Date End Date Taking? Authorizing Provider  azelastine (ASTELIN) 0.1 % nasal spray Place into the nose. 11/19/17   [provider]  Bepotastine Besilate (BEPREVE) 1.5 % SOLN Place 2 drops into both eyes 2 (two) times daily. 05/05/18   Valentina Shaggy, MD  buPROPion (WELLBUTRIN XL) 300 MG 24 hr tablet TK 1 T PO IN THE MORNING 04/19/18   [provider]  diphenhydrAMINE (BENADRYL) 25 MG tablet Take by mouth.    [provider]  Fluticasone Propionate (XHANCE) 93 MCG/ACT EXHU Place 2 sprays into the nose daily. 05/05/18   Valentina Shaggy, MD  ibuprofen (ADVIL,MOTRIN) 800 MG tablet Take 1 tablet (800 mg total) by mouth every 8 (eight)  hours as needed. 11/27/16   Lawyer, Harrell Gave, PA-C  levocetirizine (XYZAL) 5 MG tablet TK 1 T PO HS 04/04/18   [provider]  montelukast (SINGULAIR) 10 MG tablet  03/05/18   [provider]  Norethindrone Acetate-Ethinyl Estrad-FE (JUNEL FE 24) 1-20 MG-MCG(24) tablet  10/03/17   [provider]  olopatadine (PATANOL) 0.1 % ophthalmic solution Place 1 drop into both eyes 2 (two) times daily as needed for allergies. 05/06/18   Valentina Shaggy, MD  omeprazole (PRILOSEC) 20 MG capsule TK 1 C PO D 03/05/18   [provider]  Propylene Glycol 0.6 % SOLN 1 drop.    [provider]  triamcinolone (NASACORT) 55 MCG/ACT AERO nasal inhaler Place into the nose.    [provider]    Family History Family History  Problem Relation Age of Onset  . Hypertension Father   . Diabetes Father     Social History Social History   Tobacco Use  . Smoking status: Former Research scientist (life sciences)  . Smokeless tobacco: Never Used  Substance Use Topics  . Alcohol use: Yes  . Drug use: No     Allergies   Sulfa antibiotics   Review of Systems Review of Systems  All other systems reviewed and are negative.    Physical Exam Updated Vital Signs BP  114/69 (BP Location: Left Arm)   Pulse (!) 108   Temp 98.4 F (36.9 C) (Oral)   Resp 20   Ht 5\' 5"  (1.651 m)   Wt 113.4 kg   SpO2 95%   BMI 41.60 kg/m   Physical Exam Vitals signs and nursing note reviewed.  Constitutional:      General: She is not in acute distress.    Appearance: She is well-developed.  HENT:     Head: Normocephalic and atraumatic.  Eyes:     Conjunctiva/sclera: Conjunctivae normal.  Neck:     Musculoskeletal: Neck supple.  Cardiovascular:     Rate and Rhythm: Normal rate and regular rhythm.     Heart sounds: No murmur.  Pulmonary:     Effort: Pulmonary effort is normal. No respiratory distress.     Breath sounds: Normal breath sounds.  Abdominal:     Palpations: Abdomen is soft.      Tenderness: There is no abdominal tenderness.  Musculoskeletal: Normal range of motion.  Skin:    General: Skin is warm and dry.  Neurological:     Mental Status: She is alert and oriented to person, place, and time.  Psychiatric:        Mood and Affect: Mood normal.        Behavior: Behavior normal.      ED Treatments / Results  Labs (all labs ordered are listed, but only abnormal results are displayed) Labs Reviewed  CBC WITH DIFFERENTIAL/PLATELET  BASIC METABOLIC PANEL  POC SARS CORONAVIRUS 2 AG -  ED    EKG EKG Interpretation  Date/Time:  Saturday June 05 2019 22:07:13 EST Ventricular Rate:  120 PR Interval:  142 QRS Duration: 86 QT Interval:  322 QTC Calculation: 455 R Axis:   86 Text Interpretation: Sinus tachycardia T wave abnormality, consider inferior ischemia Abnormal ECG Poor R wave progression No old tracing to compare Confirmed by Dione BoozeGlick, David ([id number]) on 06/05/2019 11:08:31 PM   Radiology Dg Chest Portable 1 View  Result Date: 06/05/2019 CLINICAL DATA:  COVID-19 patient.  Patient not feeling well. EXAM: PORTABLE CHEST 1 VIEW COMPARISON:  June 05, 2019 FINDINGS: Mild opacity in the lateral left base and in the right base are identified. No pneumothorax. The lungs are otherwise clear. The cardiomediastinal silhouette is normal. IMPRESSION: 1. Mild bibasilar opacities may represent atelectasis or early subtle infiltrate. Recommend clinical correlation and attention on follow-up. Electronically Signed   By: Gerome Samavid  Williams III M.D   On: 06/05/2019 22:56    Procedures Procedures (including critical care time)  Medications Ordered in ED Medications  ondansetron (ZOFRAN-ODT) disintegrating tablet 4 mg (4 mg Oral Given 06/05/19 2225)  acetaminophen (TYLENOL) tablet 650 mg (650 mg Oral Given 06/05/19 2225)     Initial Impression / Assessment and Plan / ED Course  I have reviewed the triage vital signs and the nursing notes.  Pertinent labs & imaging  results that were available during my care of the patient were reviewed by me and considered in my medical decision making (see chart for details).        Patient with chest pain and shortness of breath.  She is Covid positive.  I do have concern for Covid PE in this patient.  I discussed patient's case with Dr. Nicanor AlconPalumbo, who has reviewed the chest x-ray, and recommends proceeding with CT angio to rule out PE.  POC covid is negative, will send follow-up.  Patient signed out to Lawyer, PA-C at shift change who will  continue care.  Final Clinical Impressions(s) / ED Diagnoses   Final diagnoses:  None    ED Discharge Orders    None       Roxy Horseman, PA-C 06/06/19 0617    Palumbo, April, MD 06/06/19 [phone]

## 2019-06-08 ENCOUNTER — Telehealth: Payer: Self-pay | Admitting: Nurse Practitioner

## 2019-06-08 NOTE — Telephone Encounter (Signed)
Called to Discuss with patient about Covid symptoms and the use of bamlanivimab, a monoclonal antibody infusion for those with mild to moderate Covid symptoms and at a high risk of hospitalization.     Pt is qualified for this infusion at the [facility] center due to co-morbid conditions and/or a member of an at-risk group.    Patient is obese.   Unable to reach pt  

## 2020-01-07 ENCOUNTER — Encounter: Payer: Self-pay | Admitting: Internal Medicine

## 2020-01-26 ENCOUNTER — Emergency Department (HOSPITAL_COMMUNITY): Payer: BLUE CROSS/BLUE SHIELD

## 2020-01-26 ENCOUNTER — Emergency Department (HOSPITAL_COMMUNITY)
Admission: EM | Admit: 2020-01-26 | Discharge: 2020-01-27 | Disposition: A | Discharge status: Home / Self Care | Payer: BLUE CROSS/BLUE SHIELD | Attending: Emergency Medicine | Admitting: Emergency Medicine

## 2020-01-26 ENCOUNTER — Encounter (HOSPITAL_COMMUNITY): Payer: Self-pay

## 2020-01-26 DIAGNOSIS — Z87891 Personal history of nicotine dependence: Secondary | ICD-10-CM | POA: Insufficient documentation

## 2020-01-26 DIAGNOSIS — Z7982 Long term (current) use of aspirin: Secondary | ICD-10-CM | POA: Insufficient documentation

## 2020-01-26 DIAGNOSIS — R1013 Epigastric pain: Secondary | ICD-10-CM | POA: Diagnosis not present

## 2020-01-26 DIAGNOSIS — Z79899 Other long term (current) drug therapy: Secondary | ICD-10-CM | POA: Diagnosis not present

## 2020-01-26 DIAGNOSIS — R109 Unspecified abdominal pain: Secondary | ICD-10-CM

## 2020-01-26 DIAGNOSIS — R110 Nausea: Secondary | ICD-10-CM | POA: Diagnosis present

## 2020-01-26 DIAGNOSIS — K219 Gastro-esophageal reflux disease without esophagitis: Secondary | ICD-10-CM | POA: Diagnosis not present

## 2020-01-26 LAB — COMPREHENSIVE METABOLIC PANEL
ALT: 13 U/L (ref 0–44)
AST: 14 U/L — ABNORMAL LOW (ref 15–41)
Albumin: 4.4 g/dL (ref 3.5–5.0)
Alkaline Phosphatase: 45 U/L (ref 38–126)
Anion gap: 11 (ref 5–15)
BUN: 13 mg/dL (ref 6–20)
CO2: 24 mmol/L (ref 22–32)
Calcium: 9.8 mg/dL (ref 8.9–10.3)
Chloride: 104 mmol/L (ref 98–111)
Creatinine, Ser: 0.82 mg/dL (ref 0.44–1.00)
GFR calc Af Amer: >60 mL/min (ref >60)
GFR calc non Af Amer: >60 mL/min (ref >60)
Glucose, Bld: 106 mg/dL — ABNORMAL HIGH (ref 70–99)
Potassium: 3.3 mmol/L — ABNORMAL LOW (ref 3.5–5.1)
Sodium: 139 mmol/L (ref 135–145)
Total Bilirubin: 0.5 mg/dL (ref 0.3–1.2)
Total Protein: 7.8 g/dL (ref 6.5–8.1)

## 2020-01-26 LAB — URINALYSIS, ROUTINE W REFLEX MICROSCOPIC
Bilirubin Urine: NEGATIVE
Glucose, UA: NEGATIVE mg/dL
Hgb urine dipstick: NEGATIVE
Ketones, ur: NEGATIVE mg/dL
Leukocytes,Ua: NEGATIVE
Nitrite: NEGATIVE
Protein, ur: NEGATIVE mg/dL
Specific Gravity, Urine: 1.016 (ref 1.005–1.030)
pH: 5 (ref 5.0–8.0)

## 2020-01-26 LAB — CBC
HCT: 39.4 % (ref 36.0–46.0)
Hemoglobin: 12.9 g/dL (ref 12.0–15.0)
MCH: 28 pg (ref 26.0–34.0)
MCHC: 32.7 g/dL (ref 30.0–36.0)
MCV: 85.7 fL (ref 80.0–100.0)
Platelets: 381 10*3/uL (ref 150–400)
RBC: 4.6 MIL/uL (ref 3.87–5.11)
RDW: 13.3 % (ref 11.5–15.5)
WBC: 13.9 10*3/uL — ABNORMAL HIGH (ref 4.0–10.5)
nRBC: 0 % (ref 0.0–0.2)

## 2020-01-26 LAB — I-STAT BETA HCG BLOOD, ED (MC, WL, AP ONLY): I-stat hCG, quantitative: <5 m[IU]/mL (ref <5)

## 2020-01-26 LAB — TROPONIN I (HIGH SENSITIVITY): Troponin I (High Sensitivity): <2 ng/L (ref <18)

## 2020-01-26 LAB — LIPASE, BLOOD: Lipase: 37 U/L (ref 11–51)

## 2020-01-26 MED ORDER — SODIUM CHLORIDE (PF) 0.9 % IJ SOLN
INTRAMUSCULAR | Status: AC
  Filled 2020-01-26: qty 50

## 2020-01-26 MED ORDER — PANTOPRAZOLE SODIUM 20 MG PO TBEC
20.0000 mg | DELAYED_RELEASE_TABLET | Freq: Every day | ORAL | 0 refills | Status: DC
Start: 2020-01-26 — End: 2020-03-21

## 2020-01-26 MED ORDER — SIMETHICONE 40 MG/0.6ML PO SUSP (UNIT DOSE)
40.0000 mg | Freq: Once | ORAL | Status: AC
  Administered 2020-01-26: 40 mg via ORAL
  Filled 2020-01-26: qty 0.6

## 2020-01-26 MED ORDER — LIDOCAINE VISCOUS HCL 2 % MT SOLN
15.0000 mL | Freq: Once | OROMUCOSAL | Status: AC
  Administered 2020-01-26: 15 mL via ORAL
  Filled 2020-01-26: qty 15

## 2020-01-26 MED ORDER — IOHEXOL 300 MG/ML  SOLN
100.0000 mL | Freq: Once | INTRAMUSCULAR | Status: AC | PRN
  Administered 2020-01-26: 100 mL via INTRAVENOUS

## 2020-01-26 MED ORDER — POTASSIUM CHLORIDE CRYS ER 20 MEQ PO TBCR
40.0000 meq | EXTENDED_RELEASE_TABLET | Freq: Once | ORAL | Status: AC
  Administered 2020-01-26: 40 meq via ORAL
  Filled 2020-01-26: qty 2

## 2020-01-26 MED ORDER — SODIUM CHLORIDE 0.9% FLUSH: 3.0000 mL | Freq: Once | INTRAVENOUS | Status: DC

## 2020-01-26 MED ORDER — ONDANSETRON HCL 4 MG/2ML IJ SOLN
4.0000 mg | Freq: Once | INTRAMUSCULAR | Status: AC
  Administered 2020-01-26: 4 mg via INTRAVENOUS
  Filled 2020-01-26: qty 2

## 2020-01-26 MED ORDER — NIZATIDINE 150 MG PO CAPS: 150.0000 mg | ORAL_CAPSULE | Freq: Every day | ORAL | 0 refills | Status: DC

## 2020-01-26 MED ORDER — FAMOTIDINE IN NACL 20-0.9 MG/50ML-% IV SOLN
20.0000 mg | Freq: Once | INTRAVENOUS | Status: AC
  Administered 2020-01-26: 20 mg via INTRAVENOUS
  Filled 2020-01-26: qty 50

## 2020-01-26 MED ORDER — MORPHINE SULFATE (PF) 4 MG/ML IV SOLN
4.0000 mg | Freq: Once | INTRAVENOUS | Status: AC
  Administered 2020-01-26: 4 mg via INTRAVENOUS
  Filled 2020-01-26: qty 1

## 2020-01-26 MED ORDER — ALUM & MAG HYDROXIDE-SIMETH 200-200-20 MG/5ML PO SUSP
30.0000 mL | Freq: Once | ORAL | Status: AC
  Administered 2020-01-26: 30 mL via ORAL
  Filled 2020-01-26: qty 30

## 2020-01-26 NOTE — ED Provider Notes (Signed)
[facility]-EMERGENCY DEPT Provider Note   CSN: [id number] Arrival date & time: 01/26/20  1039     History Chief Complaint  Patient presents with  . Abdominal Pain  . Gastroesophageal Reflux    Rita Nelson is a 39 y.o. female with pertinent past medical history of acid reflux that presents emergency department today for abdominal pain.  States that she has had acid reflux for a while, has been taking her Prilosec and Tums, states that the past 2 days it has been extremely bad, specifically in her epigastric area and periumbilical area, has been nauseated.  No vomiting.  No abnormal bowel movements, no diarrhea.  No fevers, chills, URI-like symptoms, rashes.  Denies any chest pain or shortness of breath, however states that she does have a lot of cardiac history in her family.  Has been taking Tums without relief.  Has unfortunately been in the waiting room for 7 hours, pain has been constant since then.  Denies any lower quadrant abdominal pain, pelvic pain, vaginal pain or vaginal bleeding.  Denies any abdominal surgeries besides 2 C-sections and laparoscopic intervention in pelvis.  Denies any urinary complaints.  Denies any bright red blood per rectum, melena, tarry stools.  HPI     Past Medical History:  Diagnosis Date  . Migraine     Patient Active Problem List   Diagnosis Date Noted  . Perennial allergic rhinitis 05/05/2018    Past Surgical History:  Procedure Laterality Date  . CESAREAN SECTION    . LAPAROSCOPY       OB History   No obstetric history on file.     Family History  Problem Relation Age of Onset  . Hypertension Father   . Diabetes Father     Social History   Tobacco Use  . Smoking status: Former Games developer  . Smokeless tobacco: Never Used  Vaping Use  . Vaping Use: Never used  Substance Use Topics  . Alcohol use: Yes  . Drug use: No    Home Medications Prior to Admission medications   Medication Sig Start Date End  Date Taking? Authorizing Provider  amoxicillin-clavulanate (AUGMENTIN) 875-125 MG tablet Take 1 tablet by mouth 2 (two) times daily.  01/19/20  Yes [provider]  aspirin-acetaminophen-caffeine (EXCEDRIN MIGRAINE) [phone] MG tablet Take 1 tablet by mouth every 6 (six) hours as needed for headache or migraine.   Yes [provider]  buPROPion (WELLBUTRIN XL) 300 MG 24 hr tablet Take 300 mg by mouth every evening.  04/19/18  Yes [provider]  diphenhydrAMINE (BENADRYL) 25 MG tablet Take 25 mg by mouth every 6 (six) hours as needed for itching or allergies.    Yes [provider]  gemfibrozil (LOPID) 600 MG tablet Take 600 mg by mouth 2 (two) times daily. 12/24/19  Yes [provider]  HYDROMET 5-1.5 MG/5ML syrup Take 5 mLs by mouth every 4 (four) hours as needed for cough.  01/19/20  Yes [provider]  hydroxypropyl methylcellulose / hypromellose (ISOPTO TEARS / GONIOVISC) 2.5 % ophthalmic solution Place 1 drop into both eyes 3 (three) times daily as needed for dry eyes.   Yes [provider]  montelukast (SINGULAIR) 10 MG tablet  03/05/18  Yes [provider]  Norethindrone Acetate-Ethinyl Estrad-FE (JUNEL FE 24) 1-20 MG-MCG(24) tablet Take 1 tablet by mouth daily.   Yes [provider]  Omega-3 Fatty Acids (FISH OIL) 1000 MG CAPS Take 1 capsule by mouth every evening.   Yes  [provider]  omeprazole (PRILOSEC) 20 MG capsule Take 20 mg by mouth 2 (two) times daily as needed (heartburn).  03/05/18  Yes [provider]  phentermine (ADIPEX-P) 37.5 MG tablet Take 18.75 mg by mouth daily.  12/24/19  Yes [provider]  pseudoephedrine (SUDAFED) 30 MG tablet Take 30 mg by mouth every 4 (four) hours as needed for congestion.   Yes [provider]  rOPINIRole (REQUIP) 1 MG tablet Take 1 mg by mouth daily.  12/24/19  Yes [provider]  rosuvastatin (CRESTOR) 10 MG tablet Take 10  mg by mouth daily. 12/24/19  Yes [provider]  topiramate (TOPAMAX) 100 MG tablet Take 100 mg by mouth daily. 12/24/19  Yes [provider]  azelastine (ASTELIN) 0.1 % nasal spray Place into the nose. Patient not taking: Reported on 01/26/2020 11/19/17   [provider]  Bepotastine Besilate (BEPREVE) 1.5 % SOLN Place 2 drops into both eyes 2 (two) times daily. 05/05/18   Alfonse Spruce, MD  Fluticasone Propionate Timmothy Sours) 93 MCG/ACT EXHU Place 2 sprays into the nose daily. 05/05/18   Alfonse Spruce, MD  Guaifenesin 1200 MG TB12 Take 1 tablet (1,200 mg total) by mouth 2 (two) times daily. 06/06/19   Lawyer, Cristal Deer, PA-C  ibuprofen (ADVIL,MOTRIN) 800 MG tablet Take 1 tablet (800 mg total) by mouth every 8 (eight) hours as needed. Patient not taking: Reported on 01/26/2020 11/27/16   Charlestine Night, PA-C  levocetirizine (XYZAL) 5 MG tablet Take 5 mg by mouth daily.  04/04/18   [provider]  methylPREDNISolone (MEDROL DOSEPAK) 4 MG TBPK tablet Take by mouth as directed. Patient not taking: Reported on 01/26/2020 01/19/20   [provider]  Norethindrone Acetate-Ethinyl Estrad-FE (JUNEL FE 24) 1-20 MG-MCG(24) tablet  10/03/17   [provider]  olopatadine (PATANOL) 0.1 % ophthalmic solution Place 1 drop into both eyes 2 (two) times daily as needed for allergies. Patient not taking: Reported on 01/26/2020 05/06/18   Alfonse Spruce, MD  predniSONE (DELTASONE) 50 MG tablet Take 1 tablet (50 mg total) by mouth daily. 06/06/19   Lawyer, Cristal Deer, PA-C  promethazine-dextromethorphan (PROMETHAZINE-DM) 6.25-15 MG/5ML syrup Take 5 mLs by mouth 4 (four) times daily as needed for cough. 06/06/19   Lawyer, Cristal Deer, PA-C  Propylene Glycol 0.6 % SOLN 1 drop. Patient not taking: Reported on 01/26/2020    [provider]  rOPINIRole (REQUIP) 0.25 MG tablet Take by mouth. Patient not taking: Reported on 01/26/2020 12/24/19    [provider]  triamcinolone (NASACORT) 55 MCG/ACT AERO nasal inhaler Place into the nose. Patient not taking: Reported on 01/26/2020    [provider]    Allergies    Sulfa antibiotics  Review of Systems   Review of Systems  Constitutional: Negative for chills, diaphoresis, fatigue and fever.  HENT: Negative for congestion, ear discharge, sore throat and trouble swallowing.   Eyes: Negative for pain and visual disturbance.  Respiratory: Negative for cough, shortness of breath and wheezing.   Cardiovascular: Negative for chest pain, palpitations and leg swelling.  Gastrointestinal: Positive for abdominal distention and abdominal pain. Negative for constipation, diarrhea, nausea and vomiting.  Genitourinary: Negative for decreased urine volume, difficulty urinating, dysuria, flank pain, frequency, hematuria, menstrual problem, pelvic pain, urgency and vaginal bleeding.  Musculoskeletal: Negative for back pain, neck pain and neck stiffness.  Skin: Negative for pallor.  Neurological: Negative for dizziness, speech difficulty, weakness and headaches.  Psychiatric/Behavioral: Negative for confusion.    Physical  Exam Updated Vital Signs BP (!) 135/91   Pulse 90   Temp 98.2 F (36.8 C)   Resp 13   SpO2 95%   Physical Exam Constitutional:      General: She is not in acute distress.    Appearance: Normal appearance. She is not ill-appearing, toxic-appearing or diaphoretic.  HENT:     Head: Normocephalic and atraumatic.     Mouth/Throat:     Mouth: Mucous membranes are moist.     Pharynx: Oropharynx is clear.  Eyes:     General: No scleral icterus.    Extraocular Movements: Extraocular movements intact.     Pupils: Pupils are equal, round, and reactive to light.  Cardiovascular:     Rate and Rhythm: Normal rate and regular rhythm.     Pulses: Normal pulses.     Heart sounds: Normal heart sounds.  Pulmonary:     Effort: Pulmonary effort is normal. No  respiratory distress.     Breath sounds: Normal breath sounds. No stridor. No wheezing, rhonchi or rales.  Chest:     Chest wall: No tenderness.  Abdominal:     General: Abdomen is flat. Bowel sounds are normal. There is no distension.     Palpations: Abdomen is soft.     Tenderness: There is abdominal tenderness in the epigastric area and periumbilical area. There is no right CVA tenderness, left CVA tenderness, guarding or rebound. Negative signs include Murphy's sign, Rovsing's sign and McBurney's sign.     Hernia: No hernia is present.  Musculoskeletal:        General: No swelling or tenderness. Normal range of motion.     Cervical back: Normal range of motion and neck supple. No rigidity.     Right lower leg: No edema.     Left lower leg: No edema.  Skin:    General: Skin is warm and dry.     Capillary Refill: Capillary refill takes less than 2 seconds.     Coloration: Skin is not pale.  Neurological:     General: No focal deficit present.     Mental Status: She is alert and oriented to person, place, and time.     Cranial Nerves: No cranial nerve deficit.     Sensory: No sensory deficit.     Motor: No weakness.     Coordination: Coordination normal.  Psychiatric:        Mood and Affect: Mood normal.        Behavior: Behavior normal.     ED Results / Procedures / Treatments   Labs (all labs ordered are listed, but only abnormal results are displayed) Labs Reviewed  COMPREHENSIVE METABOLIC PANEL - Abnormal; Notable for the following components:      Result Value   Potassium 3.3 (*)    Glucose, Bld 106 (*)    AST 14 (*)    All other components within normal limits  CBC - Abnormal; Notable for the following components:   WBC 13.9 (*)    All other components within normal limits  LIPASE, BLOOD  URINALYSIS, ROUTINE W REFLEX MICROSCOPIC  I-STAT BETA HCG BLOOD, ED ([facility], [facility], [facility] ONLY)  TROPONIN I (HIGH SENSITIVITY)    EKG EKG Interpretation  Date/Time:  Wednesday January 26 2020 17:41:21 EDT Ventricular Rate:  91 PR Interval:    QRS Duration: 93 QT Interval:  413 QTC Calculation: 509 R Axis:   60 Text Interpretation: Sinus rhythm Low voltage, precordial leads Borderline T abnormalities, diffuse  leads Borderline prolonged QT interval No significant change since last tracing Confirmed by Jacalyn Lefevre [phone]) on 01/26/2020 8:49:44 PM   Radiology CT ABDOMEN PELVIS W CONTRAST  Result Date: 01/26/2020 CLINICAL DATA:  39 year old female with abdominal pain and distension. EXAM: CT ABDOMEN AND PELVIS WITH CONTRAST TECHNIQUE: Multidetector CT imaging of the abdomen and pelvis was performed using the standard protocol following bolus administration of intravenous contrast. CONTRAST:  OMNIPAQUE IOHEXOL 300 MG/ML  SOLN COMPARISON:  None. FINDINGS: Lower chest: Negative. Hepatobiliary: Riedel lobe configuration suspected (normal variant). Negative liver and gallbladder. No bile duct enlargement. Pancreas: Negative. Spleen: Negative. Adrenals/Urinary Tract: Normal adrenal glands. Early renal contrast excretion to the collecting systems suspected on these images. Symmetric renal enhancement. No hydronephrosis or perinephric stranding. No definite nephrolithiasis. Both ureters appear to remain normal to the bladder. Unremarkable urinary bladder. Stomach/Bowel: Mild retained stool throughout the colon. Redundant sigmoid and hepatic flexure. The cecum is on a lax mesentery. Normal appendix (series 2, image 68). No large bowel inflammation. Negative terminal ileum. No dilated small bowel. Decompressed stomach and duodenum. No free air, free fluid, mesenteric stranding. Vascular/Lymphatic: Suboptimal intravascular contrast bolus but the major arterial structures appear patent and normal. Portal venous system appears patent. No lymphadenopathy. Reproductive: Negative. Other: No pelvic free fluid. Musculoskeletal: Negative. IMPRESSION: Negative; no acute or inflammatory process  identified in the abdomen or pelvis. Electronically Signed   By: Odessa Fleming M.D.   On: 01/26/2020 19:59    Procedures Procedures (including critical care time)  Medications Ordered in ED Medications  sodium chloride (PF) 0.9 % injection (has no administration in time range)  iohexol (OMNIPAQUE) 300 MG/ML solution 100 mL (100 mLs Intravenous Contrast Given 01/26/20 1943)  potassium chloride SA (KLOR-CON) CR tablet 40 mEq (40 mEq Oral Given 01/26/20 2035)  alum & mag hydroxide-simeth (MAALOX/MYLANTA) 200-200-20 MG/5ML suspension 30 mL (30 mLs Oral Given 01/26/20 2037)    And  lidocaine (XYLOCAINE) 2 % viscous mouth solution 15 mL (15 mLs Oral Given 01/26/20 2038)    ED Course  I have reviewed the triage vital signs and the nursing notes.  Pertinent labs & imaging results that were available during my care of the patient were reviewed by me and considered in my medical decision making (see chart for details).    MDM Rules/Calculators/A&P                         Rita Nelson is a 38 y.o. female with pertinent past medical history of acid reflux that presents emergency department today for abdominal pain.  Patient is having pain in epigastric region and periumbilical region.  Patient asking for CT at this time, will obtain since patient since patient is having periumbilical pain as well.  States that she is a Engineer, civil (consulting).  Also add on EKG and troponin at this time since patient is having epigastric pain, rule out atypical ACS since patient does have strong family history of MIs, patient asked for this as well.  Denies any chest pain, pain rating down her arm, shortness of breath.  CBC and CMP without any acute abnormalities. Potassium 3.3, will replete orally. Leukocytosis of 13.9. Lipase normal. EKG without any signs of ischemia, troponin less than 2, do not think that we need second part this time since patient has been waiting 9 hours of pain is  constant. Urinalysis negative.  CT abdomen pelvis  negative.  Upon reassessment patient states that she still feels bloated, no right  upper quadrant tenderness, no peritoneal signs.  Will give patient GI cocktail at this time.  Patient persistent on seeing MD, will have patient see Dr. Particia Nearing.  Pt care was handed off to Dr. Particia Nearing. Complete history and physical and current plan have been communicated.  Please refer to their note for the remainder of ED care and ultimate disposition.  Final Clinical Impression(s) / ED Diagnoses Final diagnoses:  Epigastric pain  Gastroesophageal reflux disease, unspecified whether esophagitis present    Rx / DC Orders ED Discharge Orders         Ordered    Ambulatory referral to Gastroenterology     Discontinue  Reprint     01/26/20 2035           Farrel Gordon, PA-C 01/26/20 2055    Jacalyn Lefevre, MD 01/26/20 2325

## 2020-01-26 NOTE — ED Notes (Signed)
Labeled urine specimen and culture sent to lab. ENMiles 

## 2020-01-26 NOTE — ED Triage Notes (Signed)
Patient arrived [facility] from home.   C/O left mid abdominal pain and indigestion X5 days after eating [place] food.   C/O bloating and belching Last BM 2 days ago   A/Ox4 Ambulatory with ems   HR-82 BP-142/80 97% RA CBG-78

## 2020-01-26 NOTE — Discharge Instructions (Addendum)
Your work-up was reassuring today, your potassium was slightly low we did give you potassium here in the ER.  Make sure to keep eating leafy greens and bananas.  Stay hydrated.  I want you to follow-up with GI, they should call you within the week however if they do not then you should call them.  Continue to take your Prilosec and your Tums. Use attached instructions.  If you have any new or worsening concerning symptoms please come back to the emergency department.

## 2020-03-03 ENCOUNTER — Ambulatory Visit: Payer: BC Managed Care – PPO | Admitting: Internal Medicine

## 2020-03-03 ENCOUNTER — Encounter: Payer: Self-pay | Admitting: Internal Medicine

## 2020-03-03 VITALS — BP 134/80 | HR 96 | Ht 64.75 in | Wt 222.1 lb

## 2020-03-03 DIAGNOSIS — R140 Abdominal distension (gaseous): Secondary | ICD-10-CM | POA: Diagnosis not present

## 2020-03-03 DIAGNOSIS — K625 Hemorrhage of anus and rectum: Secondary | ICD-10-CM | POA: Diagnosis not present

## 2020-03-03 DIAGNOSIS — K219 Gastro-esophageal reflux disease without esophagitis: Secondary | ICD-10-CM

## 2020-03-03 DIAGNOSIS — R1013 Epigastric pain: Secondary | ICD-10-CM | POA: Diagnosis not present

## 2020-03-03 NOTE — Progress Notes (Signed)
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  She reports a history of anemia for which she underwent upper endoscopy and colonoscopy in [facility] in March 2017.  I have those reports for review.  The examination was complete to the cecum with good preparation.  She was found to have left-sided diverticulosis and a 5 mm transverse colon polyp which was felt to be adenomatous.  The remainder of the examination, including the terminal ileum, was normal.  Upper endoscopy revealed changes consistent with gastritis, but was otherwise normal.  She tells me that she was in her usual state of health until around April 2021 when she describes significant rectal bleeding.  She brings a photograph from her smart phone.  She describes clots.  The image shows significant blood in the toilet bowl.  She denies having had rectal pain at that time but describes gastric upset.  The incident was isolated.  She did not have hemorrhoids on her last colonoscopy.  Next, she has been taking phentermine for weight loss.  She reports about 20 pound weight loss.  However this has been stopped in recent weeks.  About 1 month ago she developed abdominal discomfort with worsening reflux symptoms and the sensation of a markedly distended abdomen.  He describes discomfort in her back and diaphoresis.  She has nausea but no vomiting.  For this problem she went to the emergency room for evaluation.  She improved over time not specifically.  The only discharge order was ambulatory GI referral.  She does have GERD for which she takes pantoprazole 20 mg twice daily.  She has been taking H2 receptor antagonist at night.  She does report occasional dysphagia.  Review of blood work from  that encounter on January 26, 2020 reveals unremarkable comprehensive metabolic panel including liver tests and lipase.  CBC was remarkable for white blood cell count 13.9.  Otherwise normal with hemoglobin 12.9.  A CT scan of the abdomen and pelvis with contrast was unremarkable.  As well, right upper quadrant ultrasound was unremarkable.  Outside blood work from her primary provider December 20, 2019 revealed normal CBC with white blood cell count 7.1 and hemoglobin 12.4 normal comprehensive metabolic panel.  She was having some issues with constipation, though this has improved.  She has completed her Covid vaccinations.  REVIEW OF SYSTEMS:  All non-GI ROS negative unless otherwise stated in the HPI except for sinus allergy trouble, anxiety, back pain, depression, fatigue, headaches, urinary frequency, excessive urination  Past Medical History:  Diagnosis Date  . Anxiety   . Depression   . Diverticulosis   . E. coli infection   . GERD (gastroesophageal reflux disease)   . Gram-negative infection   . Hyperlipidemia   . Migraine   . Seasonal allergies   . Staph infection     Past Surgical History:  Procedure Laterality Date  . ABDOMINAL ADHESION SURGERY  2008  . CESAREAN SECTION     x 2  . LAPAROSCOPY    . TONSILLECTOMY AND ADENOIDECTOMY    . TYMPANOSTOMY TUBE PLACEMENT      Social History Rita Nelson  reports that she quit smoking about 6 years ago. Her smoking use included cigarettes. She has never used smokeless tobacco. She reports current alcohol use.  She reports that she does not use drugs.  family history includes Breast cancer in her paternal aunt and paternal grandmother; Colon polyps in her maternal grandmother; Diabetes in her father; Heart attack in her paternal grandmother; Heart disease in her father; Hypertension in her father, mother, and sister; Ovarian cancer in her maternal grandmother; Prostate cancer in an other family member; Ulcerative colitis in her maternal  grandmother.  Allergies  Allergen Reactions  . Sulfa Antibiotics        PHYSICAL EXAMINATION: Vital signs: BP 134/80 (BP Location: Left Arm, Patient Position: Sitting, Cuff Size: Normal)   Pulse 96   Ht 5' 4.75" (1.645 m) Comment: height measured without shoes  Wt 222 lb 2 oz (100.8 kg)   LMP 03/03/2017   BMI 37.25 kg/m   Constitutional: generally well-appearing, no acute distress Psychiatric: alert and oriented x3, cooperative Eyes: extraocular movements intact, anicteric, conjunctiva pink Mouth: oral pharynx moist, no lesions Neck: supple no lymphadenopathy Cardiovascular: heart regular rate and rhythm, no murmur Lungs: clear to auscultation bilaterally Abdomen: soft, obese, nontender, nondistended, no obvious ascites, no peritoneal signs, normal bowel sounds, no organomegaly Rectal: Deferred until colonoscopy Extremities: no clubbing, cyanosis, or lower extremity edema bilaterally Skin: no lesions on visible extremities Neuro: No focal deficits.  Cranial nerves intact  ASSESSMENT:  1.  Isolated episode of moderately severe rectal bleeding of uncertain etiology.  She is known to have diverticulosis.  Last colonoscopy 2017 with probable adenomatous colon polyp, based on the outside report. 2.  GERD.  Occasional mild dysphagia 3.  Problems with abdominal discomfort.  Improved since ER evaluation 4.  Abdominal distention.  I suspect this is adiposity given negative CT scan 5.  General medical problems.  Stable   PLAN:  1.  Upper endoscopy to evaluate upper GI symptoms of GERD, dysphagia, pain, and bloating discomfort.The nature of the procedure, as well as the risks, benefits, and alternatives were carefully and thoroughly reviewed with the patient. Ample time for discussion and questions allowed. The patient understood, was satisfied, and agreed to proceed. 2.  Colonoscopy to evaluate moderately severe rectal bleeding and history of colon polyps.The nature of the procedure,  as well as the risks, benefits, and alternatives were carefully and thoroughly reviewed with the patient. Ample time for discussion and questions allowed. The patient understood, was satisfied, and agreed to proceed. 3.  Reflux precautions 4.  Continue good work toward weight loss 5.  Further recommendations after the above A total time of 60 minutes was spent preparing to see the patient, reviewing outside laboratories, x-rays, endoscopy reports.  Reviewing outside histories, office notes, and your evaluations.  Person obtaining comprehensive history today.  Performing comprehensive physical examination.  Counseling the patient regarding her multiple above listed issues.  Ordering advanced procedures as described, and documenting clinical information in the health record

## 2020-03-03 NOTE — Patient Instructions (Signed)
Call me back next week to schedule your procedures

## 2020-03-15 ENCOUNTER — Telehealth: Payer: Self-pay | Admitting: Internal Medicine

## 2020-03-15 NOTE — Telephone Encounter (Signed)
Left message for pt to call back  °

## 2020-03-15 NOTE — Telephone Encounter (Signed)
Spoke with pt, she would like a call back regarding her instructions prior to her Colonoscopy and EGD.

## 2020-03-17 NOTE — Telephone Encounter (Signed)
Left message for pt to call back  °

## 2020-03-21 ENCOUNTER — Other Ambulatory Visit: Payer: Self-pay

## 2020-03-21 DIAGNOSIS — R1013 Epigastric pain: Secondary | ICD-10-CM

## 2020-03-21 DIAGNOSIS — K625 Hemorrhage of anus and rectum: Secondary | ICD-10-CM

## 2020-03-21 MED ORDER — SUTAB 1479-225-188 MG PO TABS: ORAL_TABLET | ORAL | 0 refills | Status: DC

## 2020-03-21 MED ORDER — PANTOPRAZOLE SODIUM 20 MG PO TBEC: 20.0000 mg | DELAYED_RELEASE_TABLET | Freq: Two times a day (BID) | ORAL | 0 refills | Status: DC

## 2020-03-21 NOTE — Telephone Encounter (Signed)
Pt states she has not gotten any prep instructions. Instructions sent to pt via mychart. Pt states she has had both covid vaccines. Referral in epic and prep script sent to pharmacy. Pt requested sutab.

## 2020-04-19 ENCOUNTER — Encounter: Payer: Self-pay | Admitting: Internal Medicine

## 2020-04-21 ENCOUNTER — Encounter: Payer: BC Managed Care – PPO | Admitting: Internal Medicine

## 2020-07-01 ENCOUNTER — Other Ambulatory Visit: Payer: Self-pay | Admitting: Internal Medicine

## 2021-02-16 ENCOUNTER — Other Ambulatory Visit: Payer: Self-pay

## 2021-02-16 DIAGNOSIS — N63 Unspecified lump in unspecified breast: Secondary | ICD-10-CM

## 2021-02-20 NOTE — Progress Notes (Signed)
Date:  02/21/2021   ID:  [name], DOB [birth year], MRN [id number]  PCP:  Everardo Beals, NP  Cardiologist:  Rex Kras, DO, [facility]  (established care 02/21/2021)  REASON FOR CONSULT: Tachycardia  REQUESTING PHYSICIAN:  Everardo Beals, [address] [place],  [place] [id number]  Chief Complaint  Patient presents with   New Patient (Initial Visit)   Palpitations   Shortness of Breath   Chest Pain    HPI  Rita Nelson is a 40 y.o. female who presents to the office with a chief complaint of " chest pain and palpitations." Patient's past medical history and cardiovascular risk factors include: Hx of COVID 19 infection x 3 (last July 2022), hypertriglyceridemia, depression, migraines, former smoker, obesity due to excess calories.  She is referred to the office at the request of Everardo Beals, NP for evaluation of tachycardia.  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  Chest pain: Patient states that she has had 3 COVID-19 infections despite being vaccinated and her last episode was approximately 1 month ago.  Since then she has been experiencing right-sided parasternal discomfort, she describes it as a " sternal rub," not brought on by effort related activities and does not resolve with rest.  The pain is nonradiating and self limited.  The pain is at times brought at rest, pleuritic in nature, positional.  Associated with palpitations and shortness of breath.  Patient states in the past that she has been told that she has costochondritis but would like additional work-up due to its reoccurrence.  Patient denies any prior history of pericardial effusion or rheumatic fever.  Palpitations: Since COVID-19 infection in July 2022.  Occurs on a daily basis, lasting for few seconds, sensation of" fluttering in the chest," no improving or worsening factors, has not taken  any medications regarding this, no near-syncope or syncopal events.  FUNCTIONAL STATUS: No structured exercise program or daily routine.   ALLERGIES: Allergies  Allergen Reactions   Sulfa Antibiotics     MEDICATION LIST PRIOR TO VISIT: Current Meds  Medication Sig   aspirin-acetaminophen-caffeine (EXCEDRIN MIGRAINE) 250-250-65 MG tablet Take 1 tablet by mouth every 6 (six) hours as needed for headache or migraine.   Biotin 5000 MCG CAPS Take 1 capsule by mouth daily at 12 noon.   buPROPion (WELLBUTRIN XL) 300 MG 24 hr tablet Take 300 mg by mouth every evening.    calcium carbonate (TUMS - DOSED IN MG ELEMENTAL CALCIUM) 500 MG chewable tablet Chew 1 tablet by mouth daily.   Cranberry-Vitamin C-Vitamin E (CRANBERRY PLUS VITAMIN C PO) Take 2 capsules by mouth daily.   gemfibrozil (LOPID) 600 MG tablet Take 600 mg by mouth 2 (two) times daily.   hydrochlorothiazide (HYDRODIURIL) 12.5 MG tablet Take 12.5 mg by mouth daily.   ibuprofen (ADVIL,MOTRIN) 800 MG tablet Take 1 tablet (800 mg total) by mouth every 8 (eight) hours as needed.   levocetirizine (XYZAL) 5 MG tablet Take 5 mg by mouth every evening.   montelukast (SINGULAIR) 10 MG tablet    Norethindrone Acetate-Ethinyl Estrad-FE (JUNEL FE 24) 1-20 MG-MCG(24) tablet Take 1 tablet by mouth daily.   Omega-3 Fatty Acids (FISH OIL) 1000 MG CAPS Take 1 capsule by mouth every evening.   ondansetron (ZOFRAN) 8 MG tablet Take 4 mg by mouth every 8 (eight) hours as needed for nausea or vomiting.   OVER THE COUNTER MEDICATION  Take 1 capsule by mouth daily as needed. Stacker 3 XPLC   OVER THE COUNTER MEDICATION Take 10 mg by mouth daily as needed. Sudaphed   pantoprazole (PROTONIX) 40 MG tablet Take 40 mg by mouth daily.   Rimegepant Sulfate (NURTEC) 75 MG TBDP Take 75 mg by mouth as needed.   rOPINIRole (REQUIP) 1 MG tablet Take 1 mg by mouth daily.    topiramate (TOPAMAX) 100 MG tablet Take 100 mg by mouth daily.   [DISCONTINUED] pantoprazole  (PROTONIX) 20 MG tablet TAKE 1 TABLET(20 MG) BY MOUTH TWICE DAILY     PAST MEDICAL HISTORY: Past Medical History:  Diagnosis Date   Anxiety    Depression    Diverticulosis    E. coli infection    GERD (gastroesophageal reflux disease)    Gram-negative infection    Hyperlipidemia    Migraine    Seasonal allergies    Staph infection     PAST SURGICAL HISTORY: Past Surgical History:  Procedure Laterality Date   ABDOMINAL ADHESION SURGERY  2008   CESAREAN SECTION     x 2   LAPAROSCOPY     TONSILLECTOMY AND ADENOIDECTOMY     TYMPANOSTOMY TUBE PLACEMENT      FAMILY HISTORY: The patient family history includes Breast cancer in her paternal aunt and paternal grandmother; Colon polyps in her maternal grandmother; Diabetes in her father; Heart attack in her paternal grandmother; Hypertension in her father, mother, and sister; Ovarian cancer in her maternal grandmother; Prostate cancer in an other family member; Ulcerative colitis in her maternal grandmother.  SOCIAL HISTORY:  The patient  reports that she quit smoking about 6 years ago. Her smoking use included cigarettes. She has a 15.00 pack-year smoking history. She has never used smokeless tobacco. She reports current alcohol use. She reports that she does not use drugs.  REVIEW OF SYSTEMS: Review of Systems  Constitutional: Negative for chills and fever.  HENT:  Negative for hoarse voice and nosebleeds.   Eyes:  Negative for discharge, double vision and pain.  Cardiovascular:  Positive for chest pain, dyspnea on exertion and palpitations. Negative for claudication, leg swelling, near-syncope, orthopnea, paroxysmal nocturnal dyspnea and syncope.  Respiratory:  Positive for shortness of breath. Negative for hemoptysis.   Musculoskeletal:  Negative for muscle cramps and myalgias.  Gastrointestinal:  Negative for abdominal pain, constipation, diarrhea, hematemesis, hematochezia, melena, nausea and vomiting.  Neurological:  Negative  for dizziness and light-headedness.   PHYSICAL EXAM: Vitals with BMI 02/21/2021 03/03/2020 01/26/2020  Height _0  5' 4.75" -  Weight 245 lbs 10 oz 222 lbs 2 oz -  BMI 30.07 62.26 -  Systolic 333 545 625  Diastolic 80 80 86  Pulse 74 96 70    CONSTITUTIONAL: Well-developed and well-nourished. No acute distress.  SKIN: Skin is warm and dry. No rash noted. No cyanosis. No pallor. No jaundice HEAD: Normocephalic and atraumatic.  EYES: No scleral icterus MOUTH/THROAT: Moist oral membranes.  NECK: No JVD present. No thyromegaly noted. No carotid bruits  LYMPHATIC: No visible cervical adenopathy.  CHEST Normal respiratory effort. No intercostal retractions  LUNGS: Clear to auscultation bilaterally.  No stridor. No wheezes. No rales.  CARDIOVASCULAR: Regular, positive S1-S2, no murmurs rubs or gallops appreciated. ABDOMINAL: Obese, soft, nontender, nondistended, positive bowel sounds all 4 quadrants no apparent ascites.  EXTREMITIES: No peripheral edema, 2+ DP and PT pulses, warm to touch. HEMATOLOGIC: No significant bruising NEUROLOGIC: Oriented to person, place, and time. Nonfocal. Normal muscle tone.  PSYCHIATRIC: Normal  mood and affect. Normal behavior. Cooperative  CARDIAC DATABASE: EKG: 02/21/2021: Sinus  Rhythm, 75bpm, normal axis, without underlying injury pattern., QTc 46mec.    Echocardiogram: No results found for this or any previous visit from the past 1095 days.   Stress Testing: No results found for this or any previous visit from the past 1095 days.  Heart Catheterization: None  LABORATORY DATA: CBC Latest Ref Rng & Units 01/26/2020 06/06/2019  WBC 4.0 - 10.5 K/uL 13.9(H) 3.5(L)  Hemoglobin 12.0 - 15.0 g/dL 12.9 12.3  Hematocrit 36.0 - 46.0 % 39.4 37.0  Platelets 150 - 400 K/uL 381 214    CMP Latest Ref Rng & Units 01/26/2020 06/06/2019  Glucose 70 - 99 mg/dL 106(H) 120(H)  BUN 6 - 20 mg/dL 13 8  Creatinine 0.44 - 1.00 mg/dL 0.82 0.80  Sodium 135 - 145 mmol/L  139 135  Potassium 3.5 - 5.1 mmol/L 3.3(L) 3.0(L)  Chloride 98 - 111 mmol/L 104 101  CO2 22 - 32 mmol/L 24 21(L)  Calcium 8.9 - 10.3 mg/dL 9.8 9.0  Total Protein 6.5 - 8.1 g/dL 7.8 -  Total Bilirubin 0.3 - 1.2 mg/dL 0.5 -  Alkaline Phos 38 - 126 U/L 45 -  AST 15 - 41 U/L 14(L) -  ALT 0 - 44 U/L 13 -    Lipid Panel  No results found for: CHOL, TRIG, HDL, CHOLHDL, VLDL, LDLCALC, LDLDIRECT, LABVLDL  No components found for: NTPROBNP No results for input(s): PROBNP in the last 8760 hours. No results for input(s): TSH in the last 8760 hours.  BMP No results for input(s): NA, K, CL, CO2, GLUCOSE, BUN, CREATININE, CALCIUM, GFRNONAA, GFRAA in the last 8760 hours.  HEMOGLOBIN A1C No results found for: HGBA1C, MPG  External Labs:  Date Collected: 01/27/2021 , information obtained by referring provider Potassium: 3.6 Creatinine 0.77 mg/dL. eGFR: 101 mL/min per 1.73 m Hemoglobin: 12.8 g/dL and hematocrit: 38.1 % AST: 11 , ALT: 9 , alkaline phosphatase: 46   Date Collected: 09/22/2020 , information obtained by referring provider Lipid profile: Total cholesterol 165 , triglycerides 146 , HDL 74 , LDL 68  Date Collected: 02/09/2021 , information obtained by referring provider TSH: 2.14    Date collected: 02/12/2021: D-dimer 0.23 (within normal limits).   IMPRESSION:    ICD-10-CM   1. Precordial pain  R07.2 PCV ECHOCARDIOGRAM COMPLETE    PCV CARDIAC STRESS TEST    2. Palpitations  R00.2 EKG 12-Lead    LONG TERM MONITOR (3-14 DAYS)    3. Shortness of breath  R06.02 PCV ECHOCARDIOGRAM COMPLETE    Pro b natriuretic peptide (BNP)    Basic metabolic panel    4. Tachycardia  R00.0 LONG TERM MONITOR (3-14 DAYS)    5. History of COVID-19  Z86.16 PCV ECHOCARDIOGRAM COMPLETE    CBC with Differential/Platelet    Sedimentation rate    C-reactive protein    6. Hypertriglyceridemia  E78.1     7. Former smoker  Z87.891     860 Class 3 severe obesity due to excess calories without  serious comorbidity with body mass index (BMI) of 40.0 to 44.9 in adult ([facility]  E66.01    Z68.41        RECOMMENDATIONS: MMarcelline Temkinis a 40y.o. female whose past medical history and cardiac risk factors include: Hx of COVID 19 infection x 3 (last July 2022), hypertriglyceridemia, depression, migraines, former smoker, obesity due to excess calories.  Precordial pain Predominately noncardiac in nature. Given her history of  COVID-19 infection x3 we will order an echocardiogram to evaluate for pericardial disease, structural heart disease. Check ESR and CRP and CBC with differential. Exercise treadmill stress test to evaluate for exercise-induced ischemia  Palpitations May be postinflammatory since his symptoms are started after his most recent COVID-19 infection. TSH within normal limits Ischemic work-up as noted above 14-day extended Holter monitor to evaluate for dysrhythmias.  Shortness of breath Recent D-dimer checked by PCP negative. Check BNP, BMP  Hypertriglyceridemia Currently managed by PCP. Most recent lipid profile from March 2022 independently reviewed Currently on pharmacological therapy. Educated on the importance of improving her modifiable cardiovascular risk factors.  Former smoker Educated on the importance of continued smoking cessation.  Class 3 severe obesity due to excess calories without serious comorbidity with body mass index (BMI) of 40.0 to 44.9 in adult [facility]) Body mass index is 42.16 kg/m. I reviewed with the patient the importance of diet, regular physical activity/exercise, weight loss.   Patient is educated on increasing physical activity gradually as tolerated.  With the goal of moderate intensity exercise for 30 minutes a day 5 days a week.  Since she works during the night shift, she is currently on multiple agents that can affect her sleep pattern, and given her relatively young age I recommended that she discuss with her PCP seeing sleep  medicine to evaluate for possible sleep apnea and appropriate follow-up and treatment.  FINAL MEDICATION LIST END OF ENCOUNTER: No orders of the defined types were placed in this encounter.    Current Outpatient Medications:    aspirin-acetaminophen-caffeine (EXCEDRIN MIGRAINE) 250-250-65 MG tablet, Take 1 tablet by mouth every 6 (six) hours as needed for headache or migraine., Disp: , Rfl:    Biotin 5000 MCG CAPS, Take 1 capsule by mouth daily at 12 noon., Disp: , Rfl:    buPROPion (WELLBUTRIN XL) 300 MG 24 hr tablet, Take 300 mg by mouth every evening. , Disp: , Rfl: 3   calcium carbonate (TUMS - DOSED IN MG ELEMENTAL CALCIUM) 500 MG chewable tablet, Chew 1 tablet by mouth daily., Disp: , Rfl:    Cranberry-Vitamin C-Vitamin E (CRANBERRY PLUS VITAMIN C PO), Take 2 capsules by mouth daily., Disp: , Rfl:    gemfibrozil (LOPID) 600 MG tablet, Take 600 mg by mouth 2 (two) times daily., Disp: , Rfl:    hydrochlorothiazide (HYDRODIURIL) 12.5 MG tablet, Take 12.5 mg by mouth daily., Disp: , Rfl:    ibuprofen (ADVIL,MOTRIN) 800 MG tablet, Take 1 tablet (800 mg total) by mouth every 8 (eight) hours as needed., Disp: 21 tablet, Rfl: 0   levocetirizine (XYZAL) 5 MG tablet, Take 5 mg by mouth every evening., Disp: , Rfl:    montelukast (SINGULAIR) 10 MG tablet, , Disp: , Rfl: 2   Norethindrone Acetate-Ethinyl Estrad-FE (JUNEL FE 24) 1-20 MG-MCG(24) tablet, Take 1 tablet by mouth daily., Disp: , Rfl:    Omega-3 Fatty Acids (FISH OIL) 1000 MG CAPS, Take 1 capsule by mouth every evening., Disp: , Rfl:    ondansetron (ZOFRAN) 8 MG tablet, Take 4 mg by mouth every 8 (eight) hours as needed for nausea or vomiting., Disp: , Rfl:    OVER THE COUNTER MEDICATION, Take 1 capsule by mouth daily as needed. Stacker 3 XPLC, Disp: , Rfl:    OVER THE COUNTER MEDICATION, Take 10 mg by mouth daily as needed. Sudaphed, Disp: , Rfl:    pantoprazole (PROTONIX) 40 MG tablet, Take 40 mg by mouth daily., Disp: , Rfl:  Rimegepant Sulfate (NURTEC) 75 MG TBDP, Take 75 mg by mouth as needed., Disp: , Rfl:    rOPINIRole (REQUIP) 1 MG tablet, Take 1 mg by mouth daily. , Disp: , Rfl:    topiramate (TOPAMAX) 100 MG tablet, Take 100 mg by mouth daily., Disp: , Rfl:   Orders Placed This Encounter  Procedures   CBC with Differential/Platelet   Sedimentation rate   Pro b natriuretic peptide (BNP)   C-reactive protein   Basic metabolic panel   PCV CARDIAC STRESS TEST   LONG TERM MONITOR (3-14 DAYS)   EKG 12-Lead   PCV ECHOCARDIOGRAM COMPLETE    There are no Patient Instructions on file for this visit.   --Continue cardiac medications as reconciled in final medication list. --Return in about 5 weeks (around 03/28/2021) for Review test results, Reevaluation of , Chest pain, Palpitations. Or sooner if needed. --Continue follow-up with your primary care physician regarding the management of your other chronic comorbid conditions.  Patient's questions and concerns were addressed to her satisfaction. She voices understanding of the instructions provided during this encounter.   This note was created using a voice recognition software as a result there may be grammatical errors inadvertently enclosed that do not reflect the nature of this encounter. Every attempt is made to correct such errors.  Rex Kras, [place], [facility]  Pager: [phone] Office: [phone]

## 2021-02-21 ENCOUNTER — Ambulatory Visit: Payer: BC Managed Care – PPO | Admitting: Cardiology

## 2021-02-21 ENCOUNTER — Encounter: Payer: Self-pay | Admitting: Cardiology

## 2021-02-21 ENCOUNTER — Other Ambulatory Visit: Payer: Self-pay

## 2021-02-21 ENCOUNTER — Other Ambulatory Visit: Payer: Self-pay | Admitting: Cardiology

## 2021-02-21 VITALS — BP 136/80 | HR 74 | Temp 97.6°F | Resp 16 | Ht 64.0 in | Wt 245.6 lb

## 2021-02-21 DIAGNOSIS — Z87891 Personal history of nicotine dependence: Secondary | ICD-10-CM

## 2021-02-21 DIAGNOSIS — E781 Pure hyperglyceridemia: Secondary | ICD-10-CM

## 2021-02-21 DIAGNOSIS — Z8616 Personal history of COVID-19: Secondary | ICD-10-CM

## 2021-02-21 DIAGNOSIS — R002 Palpitations: Secondary | ICD-10-CM

## 2021-02-21 DIAGNOSIS — R000 Tachycardia, unspecified: Secondary | ICD-10-CM

## 2021-02-21 DIAGNOSIS — R072 Precordial pain: Secondary | ICD-10-CM

## 2021-02-21 DIAGNOSIS — R0602 Shortness of breath: Secondary | ICD-10-CM

## 2021-02-22 LAB — CBC WITH DIFFERENTIAL/PLATELET
Basophils Absolute: 0 10*3/uL (ref 0.0–0.2)
Basos: 1 %
EOS (ABSOLUTE): 0.2 10*3/uL (ref 0.0–0.4)
Eos: 3 %
Hematocrit: 37.3 % (ref 34.0–46.6)
Hemoglobin: 12.1 g/dL (ref 11.1–15.9)
Immature Grans (Abs): 0 10*3/uL (ref 0.0–0.1)
Immature Granulocytes: 0 %
Lymphocytes Absolute: 3 10*3/uL (ref 0.7–3.1)
Lymphs: 44 %
MCH: 28.6 pg (ref 26.6–33.0)
MCHC: 32.4 g/dL (ref 31.5–35.7)
MCV: 88 fL (ref 79–97)
Monocytes Absolute: 0.5 10*3/uL (ref 0.1–0.9)
Monocytes: 7 %
Neutrophils Absolute: 3 10*3/uL (ref 1.4–7.0)
Neutrophils: 45 %
Platelets: 287 10*3/uL (ref 150–450)
RBC: 4.23 x10E6/uL (ref 3.77–5.28)
RDW: 13.5 % (ref 11.7–15.4)
WBC: 6.7 10*3/uL (ref 3.4–10.8)

## 2021-02-22 LAB — BASIC METABOLIC PANEL
BUN/Creatinine Ratio: 14 (ref 9–23)
BUN: 10 mg/dL (ref 6–20)
CO2: 18 mmol/L — ABNORMAL LOW (ref 20–29)
Calcium: 9.8 mg/dL (ref 8.7–10.2)
Chloride: 103 mmol/L (ref 96–106)
Creatinine, Ser: 0.74 mg/dL (ref 0.57–1.00)
Glucose: 82 mg/dL (ref 65–99)
Potassium: 3.6 mmol/L (ref 3.5–5.2)
Sodium: 138 mmol/L (ref 134–144)
eGFR: 105 mL/min/{1.73_m2} (ref >59)

## 2021-02-22 LAB — C-REACTIVE PROTEIN: CRP: 5 mg/L (ref 0–10)

## 2021-02-22 LAB — PRO B NATRIURETIC PEPTIDE: NT-Pro BNP: 104 pg/mL (ref 0–130)

## 2021-02-22 LAB — SEDIMENTATION RATE: Sed Rate: 3 mm/hr (ref 0–32)

## 2021-02-27 ENCOUNTER — Other Ambulatory Visit: Payer: Self-pay

## 2021-02-27 ENCOUNTER — Telehealth: Payer: Self-pay

## 2021-02-27 ENCOUNTER — Inpatient Hospital Stay: Payer: BC Managed Care – PPO

## 2021-02-27 DIAGNOSIS — R000 Tachycardia, unspecified: Secondary | ICD-10-CM

## 2021-02-27 DIAGNOSIS — R002 Palpitations: Secondary | ICD-10-CM

## 2021-02-27 NOTE — Telephone Encounter (Signed)
She can pick up a copy but I dont think patient's have access via the app.   ST

## 2021-02-27 NOTE — Telephone Encounter (Signed)
I sent you a message about this earlier 

## 2021-02-27 NOTE — Telephone Encounter (Signed)
Patient came in for her monitor this morning. She said that she could not see her ekg results through her my chart and asked if you could post them???

## 2021-02-27 NOTE — Telephone Encounter (Signed)
02/21/2021: Sinus  Rhythm, 75bpm, normal axis, without underlying injury pattern., QTc .

## 2021-03-01 ENCOUNTER — Other Ambulatory Visit: Payer: Self-pay

## 2021-03-01 DIAGNOSIS — R0602 Shortness of breath: Secondary | ICD-10-CM

## 2021-03-30 ENCOUNTER — Ambulatory Visit: Payer: BC Managed Care – PPO

## 2021-03-30 ENCOUNTER — Other Ambulatory Visit: Payer: Self-pay

## 2021-03-30 DIAGNOSIS — Z8616 Personal history of COVID-19: Secondary | ICD-10-CM

## 2021-03-30 DIAGNOSIS — R072 Precordial pain: Secondary | ICD-10-CM

## 2021-03-30 DIAGNOSIS — R0602 Shortness of breath: Secondary | ICD-10-CM

## 2021-04-04 ENCOUNTER — Other Ambulatory Visit: Payer: Self-pay

## 2021-04-04 ENCOUNTER — Ambulatory Visit
Admission: RE | Admit: 2021-04-04 | Discharge: 2021-04-04 | Disposition: A | Discharge status: Home / Self Care | Payer: Self-pay | Source: Ambulatory Visit | Attending: Physician Assistant | Admitting: Physician Assistant

## 2021-04-04 ENCOUNTER — Ambulatory Visit
Admission: RE | Admit: 2021-04-04 | Discharge: 2021-04-04 | Disposition: A | Discharge status: Home / Self Care | Payer: BC Managed Care – PPO | Source: Ambulatory Visit | Attending: Physician Assistant | Admitting: Physician Assistant

## 2021-04-04 DIAGNOSIS — N63 Unspecified lump in unspecified breast: Secondary | ICD-10-CM

## 2021-04-06 ENCOUNTER — Other Ambulatory Visit: Payer: Self-pay | Admitting: Cardiology

## 2021-04-06 ENCOUNTER — Other Ambulatory Visit: Payer: Self-pay

## 2021-04-06 ENCOUNTER — Ambulatory Visit: Payer: BC Managed Care – PPO | Admitting: Cardiology

## 2021-04-06 ENCOUNTER — Encounter: Payer: Self-pay | Admitting: Cardiology

## 2021-04-06 VITALS — BP 132/80 | HR 91 | Temp 98.0°F | Ht 64.0 in | Wt 251.0 lb

## 2021-04-06 DIAGNOSIS — Z87891 Personal history of nicotine dependence: Secondary | ICD-10-CM

## 2021-04-06 DIAGNOSIS — R002 Palpitations: Secondary | ICD-10-CM

## 2021-04-06 DIAGNOSIS — R072 Precordial pain: Secondary | ICD-10-CM

## 2021-04-06 DIAGNOSIS — R000 Tachycardia, unspecified: Secondary | ICD-10-CM

## 2021-04-06 DIAGNOSIS — Z8616 Personal history of COVID-19: Secondary | ICD-10-CM

## 2021-04-06 DIAGNOSIS — R0602 Shortness of breath: Secondary | ICD-10-CM

## 2021-04-06 DIAGNOSIS — R9439 Abnormal result of other cardiovascular function study: Secondary | ICD-10-CM

## 2021-04-06 DIAGNOSIS — Z6841 Body Mass Index (BMI) 40.0 and over, adult: Secondary | ICD-10-CM

## 2021-04-06 MED ORDER — METOPROLOL TARTRATE 50 MG PO TABS: 50.0000 mg | ORAL_TABLET | Freq: Two times a day (BID) | ORAL | 0 refills | Status: DC

## 2021-04-06 NOTE — Progress Notes (Signed)
Date:  04/06/2021   ID:  [name], DOB June 09, 1981, MRN [id number]  PCP:  Everardo Beals, NP  Cardiologist:  Rex Kras, DO, [facility]  (established care 02/21/2021)  Date: 04/06/21 Last Office Visit: 02/21/2021  Chief Complaint  Patient presents with   Chest Pain   Follow-up   Results    HPI  Rita Nelson is a 40 y.o. female who presents to the office with a chief complaint of " reevaluation of chest pain/shortness of breath, and review test results." Patient's past medical history and cardiovascular risk factors include: Hx of COVID 19 infection x 3 (last July 2022), hypertriglyceridemia, depression, migraines, former smoker, obesity due to excess calories.  She is referred to the office at the request of Everardo Beals, NP for evaluation of tachycardia.  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  Patient symptoms of chest pain at both typical and atypical features and the shared decision was to proceed with an echocardiogram as well as exercise treadmill stress test.  The echocardiogram notes preserved LVEF, no significant valvular heart disease, no pericardial effusion and this had a setting of 3 COVID infections.  ESR and CRP are also within normal limits.  Her NT proBNP is also within normal limits.  Follow-up and states that she is continues to have chest pain, occurs sporadically at times associated with activities, self-limited.  More than the chest pain she is concerned about effort related dyspnea which has been chronic and stable.  She had a 14-day extended Holter monitor which notes normal sinus rhythm followed by sinus tachycardia with a burden of approximately 33%.  No significant dysrhythmias.  Patient's GXT was considered to be abnormal as a stress ECG was positive for ischemia.  FUNCTIONAL STATUS: No structured exercise program or daily routine.    ALLERGIES: Allergies  Allergen Reactions   Sulfa Antibiotics    Sulfamethoxazole-Trimethoprim Other (See Comments)    MEDICATION LIST PRIOR TO VISIT: Current Meds  Medication Sig   aspirin-acetaminophen-caffeine (EXCEDRIN MIGRAINE) 250-250-65 MG tablet Take 1 tablet by mouth every 6 (six) hours as needed for headache or migraine.   Biotin 5000 MCG CAPS Take 1 capsule by mouth daily at 12 noon.   buPROPion (WELLBUTRIN XL) 300 MG 24 hr tablet Take 300 mg by mouth every evening.    calcium carbonate (TUMS - DOSED IN MG ELEMENTAL CALCIUM) 500 MG chewable tablet Chew 1 tablet by mouth daily.   Cranberry-Vitamin C-Vitamin E (CRANBERRY PLUS VITAMIN C PO) Take 2 capsules by mouth daily.   gemfibrozil (LOPID) 600 MG tablet Take 600 mg by mouth 2 (two) times daily.   hydrochlorothiazide (HYDRODIURIL) 12.5 MG tablet Take 12.5 mg by mouth daily.   ibuprofen (ADVIL,MOTRIN) 800 MG tablet Take 1 tablet (800 mg total) by mouth every 8 (eight) hours as needed.   levocetirizine (XYZAL) 5 MG tablet Take 5 mg by mouth every evening.   metoprolol tartrate (LOPRESSOR) 50 MG tablet Take 1 tablet (50 mg total) by mouth 2 (two) times daily.   montelukast (SINGULAIR) 10 MG tablet    Norethindrone Acetate-Ethinyl Estrad-FE (JUNEL FE 24) 1-20 MG-MCG(24) tablet Take 1 tablet by mouth daily.   Omega-3 Fatty Acids (FISH OIL) 1000 MG CAPS Take 1 capsule by mouth every evening.   ondansetron (ZOFRAN) 8 MG tablet Take 4 mg by mouth every 8 (eight) hours as needed for nausea or vomiting.   OVER THE  COUNTER MEDICATION Take 1 capsule by mouth daily as needed. Stacker 3 XPLC   OVER THE COUNTER MEDICATION Take 10 mg by mouth daily as needed. Sudaphed   pantoprazole (PROTONIX) 40 MG tablet Take 40 mg by mouth daily.   Rimegepant Sulfate (NURTEC) 75 MG TBDP Take 75 mg by mouth as needed.   rOPINIRole (REQUIP) 1 MG tablet Take 1 mg by mouth daily.    topiramate (TOPAMAX) 100 MG tablet Take 100 mg by mouth daily.     PAST  MEDICAL HISTORY: Past Medical History:  Diagnosis Date   Anxiety    Depression    Diverticulosis    E. coli infection    GERD (gastroesophageal reflux disease)    Gram-negative infection    Hyperlipidemia    Migraine    Seasonal allergies    Staph infection     PAST SURGICAL HISTORY: Past Surgical History:  Procedure Laterality Date   ABDOMINAL ADHESION SURGERY  2008   CESAREAN SECTION     x 2   LAPAROSCOPY     TONSILLECTOMY AND ADENOIDECTOMY     TYMPANOSTOMY TUBE PLACEMENT      FAMILY HISTORY: The patient family history includes Breast cancer in her paternal aunt and paternal grandmother; Colon polyps in her maternal grandmother; Diabetes in her father; Heart attack in her paternal grandmother; Hypertension in her father, mother, and sister; Ovarian cancer in her maternal grandmother; Prostate cancer in an other family member; Ulcerative colitis in her maternal grandmother.  SOCIAL HISTORY:  The patient  reports that she quit smoking about 7 years ago. Her smoking use included cigarettes. She has a 15.00 pack-year smoking history. She has never used smokeless tobacco. She reports current alcohol use. She reports that she does not use drugs.  REVIEW OF SYSTEMS: Review of Systems  Constitutional: Negative for chills and fever.  HENT:  Negative for hoarse voice and nosebleeds.   Eyes:  Negative for discharge, double vision and pain.  Cardiovascular:  Positive for chest pain, dyspnea on exertion and palpitations. Negative for claudication, leg swelling, near-syncope, orthopnea, paroxysmal nocturnal dyspnea and syncope.  Respiratory:  Positive for shortness of breath. Negative for hemoptysis.   Musculoskeletal:  Negative for muscle cramps and myalgias.  Gastrointestinal:  Negative for abdominal pain, constipation, diarrhea, hematemesis, hematochezia, melena, nausea and vomiting.  Neurological:  Negative for dizziness and light-headedness.   PHYSICAL EXAM: Vitals with BMI  04/06/2021 02/21/2021 03/03/2020  Height '5\' 4"'  '5\' 4"'  5' 4.75"  Weight 251 lbs 245 lbs 10 oz 222 lbs 2 oz  BMI 43.06 17.40 81.44  Systolic 818 563 149  Diastolic 80 80 80  Pulse 91 74 96    CONSTITUTIONAL: Well-developed and well-nourished. No acute distress.  SKIN: Skin is warm and dry. No rash noted. No cyanosis. No pallor. No jaundice HEAD: Normocephalic and atraumatic.  EYES: No scleral icterus MOUTH/THROAT: Moist oral membranes.  NECK: No JVD present. No thyromegaly noted. No carotid bruits  LYMPHATIC: No visible cervical adenopathy.  CHEST Normal respiratory effort. No intercostal retractions  LUNGS: Clear to auscultation bilaterally.  No stridor. No wheezes. No rales.  CARDIOVASCULAR: Regular, positive S1-S2, no murmurs rubs or gallops appreciated. ABDOMINAL: Obese, soft, nontender, nondistended, positive bowel sounds all 4 quadrants no apparent ascites.  EXTREMITIES: No peripheral edema, 2+ DP and PT pulses, warm to touch. HEMATOLOGIC: No significant bruising NEUROLOGIC: Oriented to person, place, and time. Nonfocal. Normal muscle tone.  PSYCHIATRIC: Normal mood and affect. Normal behavior. Cooperative  CARDIAC DATABASE: EKG: 02/21/2021: Sinus  Rhythm, 75bpm, normal axis, without underlying injury pattern., QTc 438mec.    Echocardiogram: 03/30/2021: Normal LV systolic function with visual EF 60-65%. Left ventricle cavity is normal in size. Mild left ventricular hypertrophy. Normal global wall motion. Normal diastolic filling pattern, normal LAP. Mild tricuspid regurgitation. No evidence of pulmonary hypertension. No prior study for comparison.   Stress Testing: Exercise treadmill stress test 03/30/2021: Functional status: Poor.  Chest pain: No.  Reason for stopping exercise: Fatigue/weakness.  Hypertensive response to exercise: No.  Exercise time 7 minutes 05 seconds on Bruce protocol, achieved 8.7METS, 92% of age-predicted maximum heart rate.  Stress ECG positive for  ischemia.   Abnormal GXT. Consider further cardiac work up if clinically indicated.  Heart Catheterization: None  14 day extended Holter monitor: Dominant rhythm normal sinus, followed by sinus tachycardia (33% burden). Heart rate 61-157 bpm. Avg HR 96 bpm. No atrial fibrillation, supraventricular tachycardia, ventricular tachycardia, high grade AV block, pauses (3 seconds or longer). Total ventricular ectopic burden <1%. Total supraventricular ectopic burden <1%. Patient triggered events: 12. Predominantly sinus tachycardia followed by normal sinus rhythm with rare PVCs.   LABORATORY DATA: CBC Latest Ref Rng & Units 02/21/2021 01/26/2020 06/06/2019  WBC 3.4 - 10.8 x10E3/uL 6.7 13.9(H) 3.5(L)  Hemoglobin 11.1 - 15.9 g/dL 12.1 12.9 12.3  Hematocrit 34.0 - 46.6 % 37.3 39.4 37.0  Platelets 150 - 450 x10E3/uL 287 381 214    CMP Latest Ref Rng & Units 02/21/2021 01/26/2020 06/06/2019  Glucose 65 - 99 mg/dL 82 106(H) 120(H)  BUN 6 - 20 mg/dL '10 13 8  ' Creatinine 0.57 - 1.00 mg/dL 0.74 0.82 0.80  Sodium 134 - 144 mmol/L 138 139 135  Potassium 3.5 - 5.2 mmol/L 3.6 3.3(L) 3.0(L)  Chloride 96 - 106 mmol/L 103 104 101  CO2 20 - 29 mmol/L 18(L) 24 21(L)  Calcium 8.7 - 10.2 mg/dL 9.8 9.8 9.0  Total Protein 6.5 - 8.1 g/dL - 7.8 -  Total Bilirubin 0.3 - 1.2 mg/dL - 0.5 -  Alkaline Phos 38 - 126 U/L - 45 -  AST 15 - 41 U/L - 14(L) -  ALT 0 - 44 U/L - 13 -    Lipid Panel  No results found for: CHOL, TRIG, HDL, CHOLHDL, VLDL, LDLCALC, LDLDIRECT, LABVLDL  No components found for: NTPROBNP Recent Labs    02/21/21 1140  PROBNP 104   No results for input(s): TSH in the last 8760 hours.  BMP Recent Labs    02/21/21 1142  NA 138  K 3.6  CL 103  CO2 18*  GLUCOSE 82  BUN 10  CREATININE 0.74  CALCIUM 9.8    HEMOGLOBIN A1C No results found for: HGBA1C, MPG  External Labs:  Date Collected: 01/27/2021 , information obtained by referring provider Potassium: 3.6 Creatinine 0.77  mg/dL. eGFR: 101 mL/min per 1.73 m Hemoglobin: 12.8 g/dL and hematocrit: 38.1 % AST: 11 , ALT: 9 , alkaline phosphatase: 46   Date Collected: 09/22/2020 , information obtained by referring provider Lipid profile: Total cholesterol 165 , triglycerides 146 , HDL 74 , LDL 68  Date Collected: 02/09/2021 , information obtained by referring provider TSH: 2.14    Date collected: 02/12/2021: D-dimer 0.23 (within normal limits).   IMPRESSION:    ICD-10-CM   1. Precordial pain  R07.2 metoprolol tartrate (LOPRESSOR) 50 MG tablet    Basic metabolic panel    Beta HCG, Quant    CT CORONARY MORPH W/CTA COR W/SCORE W/CA W/CM &/OR WO/CM    2.  Abnormal stress electrocardiogram test using treadmill  R94.39 CT CORONARY MORPH W/CTA COR W/SCORE W/CA W/CM &/OR WO/CM    3. Shortness of breath  R06.02 CT CORONARY MORPH W/CTA COR W/SCORE W/CA W/CM &/OR WO/CM    4. Tachycardia  R00.0 metoprolol tartrate (LOPRESSOR) 50 MG tablet    5. Palpitations  R00.2 metoprolol tartrate (LOPRESSOR) 50 MG tablet    6. History of COVID-19  Z86.16     7. Former smoker  Z87.891     68. Class 3 severe obesity due to excess calories without serious comorbidity with body mass index (BMI) of 40.0 to 44.9 in adult [facility])  E66.01    Z68.41        RECOMMENDATIONS: Rita Nelson is a 40 y.o. female whose past medical history and cardiac risk factors include: Hx of COVID 19 infection x 3 (last July 2022), hypertriglyceridemia, depression, migraines, former smoker, obesity due to excess calories.  Precordial pain Given her COVID-19 infection x3 checked inflammatory markers which are within normal limits and echocardiogram notes no pericardial effusion.   Echo noted preserved LVEF, normal diastolic function, without any significant valvular heart disease. Exercise treadmill stress test was considered to be abnormal as stress ECG was concerning for ischemia  Shared decision is to proceed with coronary CTA to evaluate for  obstructive CAD plus or minus CT FFR.   Will start Lopressor.  Palpitations Most likely secondary to sinus tachycardia. 14-day extended Holter monitor noted no significant dysrhythmia burden Will start Lopressor 50 mg p.o. twice daily  Shortness of breath Recent D-dimer checked by PCP negative. BNP within normal limits. Overall euvolemic. No concern for congestive heart failure. Would recommend further work-up with pulmonary medicine if clinically warranted-will defer to primary team  Hypertriglyceridemia Currently managed by PCP. Most recent lipid profile from March 2022 independently reviewed Currently on pharmacological therapy. Educated on the importance of improving her modifiable cardiovascular risk factors.  Former smoker Educated on the importance of continued smoking cessation.  Class 3 severe obesity due to excess calories without serious comorbidity with body mass index (BMI) of 40.0 to 44.9 in adult [facility]) Body mass index is 43.08 kg/m. I reviewed with the patient the importance of diet, regular physical activity/exercise, weight loss.   Patient is educated on increasing physical activity gradually as tolerated.  With the goal of moderate intensity exercise for 30 minutes a day 5 days a week.  FINAL MEDICATION LIST END OF ENCOUNTER: Meds ordered this encounter  Medications   metoprolol tartrate (LOPRESSOR) 50 MG tablet    Sig: Take 1 tablet (50 mg total) by mouth 2 (two) times daily.    Dispense:  60 tablet    Refill:  0      Current Outpatient Medications:    aspirin-acetaminophen-caffeine (EXCEDRIN MIGRAINE) 250-250-65 MG tablet, Take 1 tablet by mouth every 6 (six) hours as needed for headache or migraine., Disp: , Rfl:    Biotin 5000 MCG CAPS, Take 1 capsule by mouth daily at 12 noon., Disp: , Rfl:    buPROPion (WELLBUTRIN XL) 300 MG 24 hr tablet, Take 300 mg by mouth every evening. , Disp: , Rfl: 3   calcium carbonate (TUMS - DOSED IN MG ELEMENTAL CALCIUM)  500 MG chewable tablet, Chew 1 tablet by mouth daily., Disp: , Rfl:    Cranberry-Vitamin C-Vitamin E (CRANBERRY PLUS VITAMIN C PO), Take 2 capsules by mouth daily., Disp: , Rfl:    gemfibrozil (LOPID) 600 MG tablet, Take 600 mg by mouth 2 (two) times  daily., Disp: , Rfl:    hydrochlorothiazide (HYDRODIURIL) 12.5 MG tablet, Take 12.5 mg by mouth daily., Disp: , Rfl:    ibuprofen (ADVIL,MOTRIN) 800 MG tablet, Take 1 tablet (800 mg total) by mouth every 8 (eight) hours as needed., Disp: 21 tablet, Rfl: 0   levocetirizine (XYZAL) 5 MG tablet, Take 5 mg by mouth every evening., Disp: , Rfl:    metoprolol tartrate (LOPRESSOR) 50 MG tablet, Take 1 tablet (50 mg total) by mouth 2 (two) times daily., Disp: 60 tablet, Rfl: 0   montelukast (SINGULAIR) 10 MG tablet, , Disp: , Rfl: 2   Norethindrone Acetate-Ethinyl Estrad-FE (JUNEL FE 24) 1-20 MG-MCG(24) tablet, Take 1 tablet by mouth daily., Disp: , Rfl:    Omega-3 Fatty Acids (FISH OIL) 1000 MG CAPS, Take 1 capsule by mouth every evening., Disp: , Rfl:    ondansetron (ZOFRAN) 8 MG tablet, Take 4 mg by mouth every 8 (eight) hours as needed for nausea or vomiting., Disp: , Rfl:    OVER THE COUNTER MEDICATION, Take 1 capsule by mouth daily as needed. Stacker 3 XPLC, Disp: , Rfl:    OVER THE COUNTER MEDICATION, Take 10 mg by mouth daily as needed. Sudaphed, Disp: , Rfl:    pantoprazole (PROTONIX) 40 MG tablet, Take 40 mg by mouth daily., Disp: , Rfl:    Rimegepant Sulfate (NURTEC) 75 MG TBDP, Take 75 mg by mouth as needed., Disp: , Rfl:    rOPINIRole (REQUIP) 1 MG tablet, Take 1 mg by mouth daily. , Disp: , Rfl:    topiramate (TOPAMAX) 100 MG tablet, Take 100 mg by mouth daily., Disp: , Rfl:   Orders Placed This Encounter  Procedures   CT CORONARY MORPH W/CTA COR W/SCORE W/CA W/CM &/OR WO/CM   Basic metabolic panel   Beta HCG, Quant    There are no Patient Instructions on file for this visit.   --Continue cardiac medications as reconciled in final  medication list. --Return in about 4 weeks (around 05/04/2021) for Follow up, Chest pain, Dyspnea. Or sooner if needed. --Continue follow-up with your primary care physician regarding the management of your other chronic comorbid conditions.  Patient's questions and concerns were addressed to her satisfaction. She voices understanding of the instructions provided during this encounter.   This note was created using a voice recognition software as a result there may be grammatical errors inadvertently enclosed that do not reflect the nature of this encounter. Every attempt is made to correct such errors.  Rex Kras, [place], [facility]  Pager: [phone] Office: [phone]

## 2021-04-09 ENCOUNTER — Telehealth (HOSPITAL_COMMUNITY): Payer: Self-pay | Admitting: *Deleted

## 2021-04-09 NOTE — Telephone Encounter (Signed)
Attempted to call patient regarding upcoming cardiac CT appointment. °Left message on voicemail with name and callback number ° °Namita Yearwood RN Navigator Cardiac Imaging °[facility] °[phone] Office °[phone] Cell ° °

## 2021-04-10 ENCOUNTER — Ambulatory Visit (HOSPITAL_COMMUNITY)
Admission: RE | Admit: 2021-04-10 | Discharge: 2021-04-10 | Disposition: A | Discharge status: Home / Self Care | Payer: BC Managed Care – PPO | Source: Ambulatory Visit | Attending: *Deleted | Admitting: *Deleted

## 2021-04-10 ENCOUNTER — Other Ambulatory Visit: Payer: Self-pay

## 2021-04-10 ENCOUNTER — Encounter (HOSPITAL_COMMUNITY): Payer: Self-pay

## 2021-04-10 DIAGNOSIS — R072 Precordial pain: Secondary | ICD-10-CM | POA: Diagnosis present

## 2021-04-10 DIAGNOSIS — R9439 Abnormal result of other cardiovascular function study: Secondary | ICD-10-CM | POA: Insufficient documentation

## 2021-04-10 DIAGNOSIS — R0602 Shortness of breath: Secondary | ICD-10-CM | POA: Insufficient documentation

## 2021-04-10 LAB — BASIC METABOLIC PANEL
BUN/Creatinine Ratio: 15 (ref 9–23)
BUN: 15 mg/dL (ref 6–20)
CO2: 18 mmol/L — ABNORMAL LOW (ref 20–29)
Calcium: 9.7 mg/dL (ref 8.7–10.2)
Chloride: 102 mmol/L (ref 96–106)
Creatinine, Ser: 0.97 mg/dL (ref 0.57–1.00)
Glucose: 88 mg/dL (ref 70–99)
Potassium: 4 mmol/L (ref 3.5–5.2)
Sodium: 140 mmol/L (ref 134–144)
eGFR: 76 mL/min/{1.73_m2} (ref >59)

## 2021-04-10 LAB — BETA HCG QUANT (REF LAB): hCG Quant: <1 m[IU]/mL

## 2021-04-10 MED ORDER — METOPROLOL TARTRATE 5 MG/5ML IV SOLN
INTRAVENOUS | Status: AC
  Filled 2021-04-10: qty 5

## 2021-04-10 MED ORDER — NITROGLYCERIN 0.4 MG SL SUBL
SUBLINGUAL_TABLET | SUBLINGUAL | Status: AC
  Filled 2021-04-10: qty 2

## 2021-04-10 MED ORDER — NITROGLYCERIN 0.4 MG SL SUBL
0.8000 mg | SUBLINGUAL_TABLET | Freq: Once | SUBLINGUAL | Status: AC
  Administered 2021-04-10: 0.8 mg via SUBLINGUAL

## 2021-04-10 MED ORDER — METOPROLOL TARTRATE 5 MG/5ML IV SOLN
5.0000 mg | INTRAVENOUS | Status: DC | PRN
  Administered 2021-04-10: 5 mg via INTRAVENOUS

## 2021-04-10 MED ORDER — IOHEXOL 350 MG/ML SOLN
95.0000 mL | Freq: Once | INTRAVENOUS | Status: AC | PRN
  Administered 2021-04-10: 95 mL via INTRAVENOUS

## 2021-04-11 DIAGNOSIS — R9439 Abnormal result of other cardiovascular function study: Secondary | ICD-10-CM | POA: Insufficient documentation

## 2021-04-11 DIAGNOSIS — R072 Precordial pain: Secondary | ICD-10-CM | POA: Insufficient documentation

## 2021-04-17 NOTE — Progress Notes (Signed)
Patient came in for an appt

## 2021-05-04 ENCOUNTER — Ambulatory Visit: Payer: BC Managed Care – PPO | Admitting: Internal Medicine

## 2021-05-04 ENCOUNTER — Encounter: Payer: Self-pay | Admitting: Internal Medicine

## 2021-05-04 VITALS — BP 112/78 | HR 76 | Ht 65.0 in | Wt 249.0 lb

## 2021-05-04 DIAGNOSIS — K5900 Constipation, unspecified: Secondary | ICD-10-CM | POA: Diagnosis not present

## 2021-05-04 DIAGNOSIS — K219 Gastro-esophageal reflux disease without esophagitis: Secondary | ICD-10-CM | POA: Diagnosis not present

## 2021-05-04 DIAGNOSIS — R140 Abdominal distension (gaseous): Secondary | ICD-10-CM

## 2021-05-04 DIAGNOSIS — R932 Abnormal findings on diagnostic imaging of liver and biliary tract: Secondary | ICD-10-CM

## 2021-05-04 NOTE — Patient Instructions (Signed)
If you are age 40 or older, your body mass index should be between 23-30. Your Body mass index is 41.44 kg/m. If this is out of the aforementioned range listed, please consider follow up with your Primary Care Provider.  If you are age 53 or younger, your body mass index should be between 19-25. Your Body mass index is 41.44 kg/m. If this is out of the aformentioned range listed, please consider follow up with your Primary Care Provider.   ________________________________________________________  The  GI providers would like to encourage you to use [facility] to communicate with providers for non-urgent requests or questions.  Due to long hold times on the telephone, sending your provider a message by [facility] may be a faster and more efficient way to get a response.  Please allow 48 business hours for a response.  Please remember that this is for non-urgent requests.  _______________________________________________________  Bonita Quin have been given some samples of Linzess 290 mcg.  Take 1 tablet 30 minutes before your first meal of the day.  If this works well for you, call the office and I will send in a prescription.    Pick up over the counter Pepcid 20 mg and take one at bedtime

## 2021-05-04 NOTE — Progress Notes (Signed)
HISTORY OF PRESENT ILLNESS:  Rita Nelson is a 40 y.o. female [employer] employee who presents today with concerns over hepatomegaly, abnormal imaging, and chronic bloating discomfort.  Patient was initially evaluated March 03, 2020 regarding a myriad of GI complaints including rectal bleeding, abdominal bloating, abdominal pain, GERD, change in bowel habits.  Prior GI evaluations in [place].  At the time of her last visit endoscopic procedures were recommended.  She canceled due to co-pay issues.  She tells me that she is continue with chronic bloating.  Constipation.  No further bleeding.  No weight loss.  Actually, gained 25 pounds over the past year.  She tells me that she has had COVID 3 times.  She is having breakthrough reflux symptoms.  Takes Tums.  No dysphagia.  Review of blood work from August 2022 shows unremarkable basic metabolic panel.  Normal CBC with hemoglobin 12.1.  Outside liver tests are normal.  She did undergo abdominal ultrasound via [facility] which reported a 22 cm liver.  CT imaging last year and this year demonstrated normal liver.  Riedel's lobe variant.  REVIEW OF SYSTEMS:  All non-GI ROS negative unless otherwise stated in the HPI except for sinus and allergy, anxiety, back pain, confusion, depression, fatigue, headaches, itching, muscle cramps, sleeping problems, excessive urination, urinary frequency, urinary leakage, shortness of breath  Past Medical History:  Diagnosis Date   Anxiety    Depression    Diverticulosis    E. coli infection    GERD (gastroesophageal reflux disease)    Gram-negative infection    Hyperlipidemia    Migraine    Seasonal allergies    Staph infection     Past Surgical History:  Procedure Laterality Date   ABDOMINAL ADHESION SURGERY  2008   CESAREAN SECTION     x 2   LAPAROSCOPY     TONSILLECTOMY AND ADENOIDECTOMY     TYMPANOSTOMY TUBE PLACEMENT      Social History Rita Nelson  reports that she quit smoking about 7 years ago.  Her smoking use included cigarettes. She has a 15.00 pack-year smoking history. She has never used smokeless tobacco. She reports current alcohol use. She reports that she does not use drugs.  family history includes Alzheimer's disease in her paternal grandfather; Breast cancer in her paternal aunt and paternal grandmother; Colon polyps in her maternal grandmother; Diabetes in her father and paternal grandfather; Diverticulosis in her paternal grandfather; Heart attack in her paternal grandmother; Hypertension in her father, mother, and sister; Ovarian cancer in her maternal grandmother; Prostate cancer in an other family member; Ulcerative colitis in her maternal grandmother.  Allergies  Allergen Reactions   Sulfa Antibiotics    Sulfamethoxazole-Trimethoprim Other (See Comments)       PHYSICAL EXAMINATION: Vital signs: BP 112/78   Pulse 76   Ht '5\' 5"'  (1.651 m)   Wt 249 lb (112.9 kg)   SpO2 98%   BMI 41.44 kg/m   Constitutional: generally well-appearing, no acute distress Psychiatric: alert and oriented x3, cooperative Eyes: extraocular movements intact, anicteric, conjunctiva pink Mouth: oral pharynx moist, no lesions Neck: supple no lymphadenopathy Cardiovascular: heart regular rate and rhythm, no murmur Lungs: clear to auscultation bilaterally Abdomen: soft, obese, nontender, nondistended, no obvious ascites, no peritoneal signs, normal bowel sounds, no organomegaly Rectal: Omitted Extremities: no clubbing, cyanosis, or lower extremity edema bilaterally Skin: no lesions on visible extremities Neuro: No focal deficits. No asterixis.     ASSESSMENT:  1.  Upper abdominal discomfort and bloating.  Chronic. 2.  Enlarged liver on ultrasound.  Other imaging including CT scans reviewed.  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  Normal liver test.  No evidence for liver disease 3.  Morbid obesity 4.  GERD.  Some breakthrough symptoms 5.  Constipation with bloating  discomfort.   PLAN:  1.  Reassurance regarding imaging findings of the lower 2.  Reflux precautions 3.  Continue PPI 4.  Add Pepcid at night she will pick up OTC. 5.  Weight loss.  Very important. 6.  Samples of Linzess 290 mcg daily provided.  If this helps her constipation and bloating discomfort, then we can prescribe this for her use.  Medication risks reviewed 7.  GI follow-up as needed

## 2021-05-07 ENCOUNTER — Other Ambulatory Visit: Payer: Self-pay | Admitting: Cardiology

## 2021-05-07 ENCOUNTER — Encounter: Payer: Self-pay | Admitting: Cardiology

## 2021-05-07 ENCOUNTER — Ambulatory Visit: Payer: BC Managed Care – PPO | Admitting: Cardiology

## 2021-05-07 ENCOUNTER — Other Ambulatory Visit: Payer: Self-pay

## 2021-05-07 VITALS — BP 111/65 | HR 79 | Resp 16 | Ht 65.0 in | Wt 249.2 lb

## 2021-05-07 DIAGNOSIS — Z8616 Personal history of COVID-19: Secondary | ICD-10-CM

## 2021-05-07 DIAGNOSIS — R072 Precordial pain: Secondary | ICD-10-CM

## 2021-05-07 DIAGNOSIS — Z6841 Body Mass Index (BMI) 40.0 and over, adult: Secondary | ICD-10-CM

## 2021-05-07 DIAGNOSIS — R000 Tachycardia, unspecified: Secondary | ICD-10-CM

## 2021-05-07 DIAGNOSIS — Z87891 Personal history of nicotine dependence: Secondary | ICD-10-CM

## 2021-05-07 DIAGNOSIS — R0602 Shortness of breath: Secondary | ICD-10-CM

## 2021-05-07 MED ORDER — DILTIAZEM HCL 30 MG PO TABS: 30.0000 mg | ORAL_TABLET | Freq: Two times a day (BID) | ORAL | 0 refills | Status: DC

## 2021-05-07 NOTE — Progress Notes (Signed)
Date:  05/07/2021   ID:  Rita Nelson, DOB 11/29/1980, MRN [id number]  PCP:  Rita Beals, NP  Cardiologist:  Rita Kras, DO, [facility]  (established care 02/21/2021)  Date: 05/07/21 Last Office Visit: 04/06/2021   Chief Complaint  Patient presents with   Chest Pain   Shortness of Breath    HPI  Rita Nelson is a 40 y.o. female who presents to the office with a chief complaint of " reevaluation of chest pain and shortness of breath." Patient's past medical history and cardiovascular risk factors include: Hx of COVID 19 infection x 3 (last July 2022), hypertriglyceridemia, depression, migraines, former smoker, obesity due to excess calories.  She is referred to the office at the request of Rita Beals, NP for evaluation of tachycardia.  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  With regards to chest pain she has undergone an ischemic evaluation including an echo and exercise treadmill stress test.  Echocardiogram noted preserved LVEF additional details noted below and GXT noted stress ECG to be positive for ischemia and poor functional capacity.  At last office visit the shared decision was to proceed with coronary CTA to evaluate for CAD.  She underwent coronary CTA and was noted to have a total calcium score of 0 and no evidence of epicardial coronary artery disease.  With regards to palpitations she underwent an extended Holter monitor which noted the underlying rhythm to be sinus followed by sinus tachycardia.  Her sinus tachycardia burden was approximately 33%.  At the last office visit she was started on Lopressor 50 mg p.o. twice daily.  In addition, given her COVID infections x3 she underwent laboratory testing which noted normal ESR and CRP and NT proBNP also within normal limits.  She presents today for follow-up.   Since last office visit, chest pain is improved significantly for the precordial pain at times and is still present.  Patient continues to have shortness of breath predominantly with effort related activities.  She is in the process of being evaluated by pulmonary medicine as she has had history of COVID infection x3, former smoker, recreationally uses CBD vape pens, and may have undiagnosed sleep apnea.  Given the clinical circumstances is very appropriate.  Patient was placed on Lopressor prior to her recent coronary CTA.  She continues to take this medication despite the completion of coronary CTA and states that her palpitations have improved.  However at times he feels tired and fatigued.  She also informs me that she has baseline depression while she was in [place] and was on Wellbutrin and Zoloft.  She recently followed up with her PCP couple weeks ago and was replaced on Zoloft.  She denies any active depressive thoughts at today's visit.  FUNCTIONAL STATUS: No structured exercise program or daily routine.   ALLERGIES: Allergies  Allergen Reactions   Sulfa Antibiotics    Sulfamethoxazole-Trimethoprim Other (See Comments)    MEDICATION LIST PRIOR TO VISIT: Current Meds  Medication Sig   aspirin-acetaminophen-caffeine (EXCEDRIN MIGRAINE) 250-250-65 MG tablet Take 1 tablet by mouth every 6 (six) hours as needed for headache or migraine.   Biotin 5000 MCG CAPS Take 1 capsule by mouth daily at 12 noon.   buPROPion (WELLBUTRIN XL) 300 MG 24 hr tablet Take 300 mg by mouth every evening.    Cranberry-Vitamin C-Vitamin E (CRANBERRY PLUS VITAMIN  C PO) Take 2 capsules by mouth daily.   gemfibrozil (LOPID) 600 MG tablet Take 600 mg by mouth 2 (two) times daily.   hydrochlorothiazide (HYDRODIURIL) 12.5 MG tablet Take 12.5 mg by mouth daily.   ibuprofen (ADVIL,MOTRIN) 800 MG tablet Take 1 tablet (800 mg total) by mouth every 8 (eight) hours as needed.   levocetirizine (XYZAL) 5 MG tablet Take 5 mg  by mouth every evening.   linaclotide (LINZESS) 290 MCG CAPS capsule Take 290 mcg by mouth daily before breakfast.   montelukast (SINGULAIR) 10 MG tablet    Norethindrone Acetate-Ethinyl Estrad-FE (JUNEL FE 24) 1-20 MG-MCG(24) tablet Take 1 tablet by mouth daily.   Omega-3 Fatty Acids (FISH OIL) 1000 MG CAPS Take 1 capsule by mouth every evening.   ondansetron (ZOFRAN) 8 MG tablet Take 4 mg by mouth every 8 (eight) hours as needed for nausea or vomiting.   OVER THE COUNTER MEDICATION Take 1 capsule by mouth daily as needed. Stacker 3 XPLC   OVER THE COUNTER MEDICATION Take 10 mg by mouth daily as needed. Sudaphed   pantoprazole (PROTONIX) 40 MG tablet Take 40 mg by mouth daily.   Rimegepant Sulfate (NURTEC) 75 MG TBDP Take 75 mg by mouth as needed.   rOPINIRole (REQUIP) 1 MG tablet Take 1 mg by mouth daily.    sertraline (ZOLOFT) 25 MG tablet sertraline 25 mg tablet  Take 1 tablet every day by oral route for 14 days.   topiramate (TOPAMAX) 100 MG tablet Take 100 mg by mouth daily.   [DISCONTINUED] diltiazem (CARDIZEM) 30 MG tablet Take 1 tablet (30 mg total) by mouth 2 (two) times daily.   [DISCONTINUED] metoprolol tartrate (LOPRESSOR) 50 MG tablet TAKE 1 TABLET(50 MG) BY MOUTH TWICE DAILY     PAST MEDICAL HISTORY: Past Medical History:  Diagnosis Date   Anxiety    Depression    Diverticulosis    E. coli infection    GERD (gastroesophageal reflux disease)    Gram-negative infection    Hyperlipidemia    Migraine    Seasonal allergies    Staph infection     PAST SURGICAL HISTORY: Past Surgical History:  Procedure Laterality Date   ABDOMINAL ADHESION SURGERY  2008   CESAREAN SECTION     x 2   LAPAROSCOPY     TONSILLECTOMY AND ADENOIDECTOMY     TYMPANOSTOMY TUBE PLACEMENT      FAMILY HISTORY: The patient family history includes Alzheimer's disease in her paternal grandfather; Breast cancer in her paternal aunt and paternal grandmother; Colon polyps in her maternal  grandmother; Diabetes in her father and paternal grandfather; Diverticulosis in her paternal grandfather; Heart attack in her paternal grandmother; Hypertension in her father, mother, and sister; Ovarian cancer in her maternal grandmother; Prostate cancer in an other family member; Ulcerative colitis in her maternal grandmother.  SOCIAL HISTORY:  The patient  reports that she quit smoking about 7 years ago. Her smoking use included cigarettes. She has a 15.00 pack-year smoking history. She has never used smokeless tobacco. She reports current alcohol use. She reports that she does not use drugs.  REVIEW OF SYSTEMS: Review of Systems  Constitutional: Negative for chills and fever.  HENT:  Negative for hoarse voice and nosebleeds.   Eyes:  Negative for discharge, double vision and pain.  Cardiovascular:  Positive for dyspnea on exertion and palpitations (improved). Negative for chest pain, claudication, leg swelling, near-syncope, orthopnea, paroxysmal nocturnal dyspnea and syncope.  Respiratory:  Positive for shortness of breath.  Negative for hemoptysis.   Musculoskeletal:  Negative for muscle cramps and myalgias.  Gastrointestinal:  Negative for abdominal pain, constipation, diarrhea, hematemesis, hematochezia, melena, nausea and vomiting.  Neurological:  Negative for dizziness and light-headedness.   PHYSICAL EXAM: Vitals with BMI 05/07/2021 05/04/2021 04/10/2021  Height _0  _1  -  Weight 249 lbs 3 oz 249 lbs -  BMI 63.14 97.02 -  Systolic 637 858 850  Diastolic 65 78 65  Pulse 79 76 71    CONSTITUTIONAL: Well-developed and well-nourished. No acute distress.  SKIN: Skin is warm and dry. No rash noted. No cyanosis. No pallor. No jaundice HEAD: Normocephalic and atraumatic.  EYES: No scleral icterus MOUTH/THROAT: Moist oral membranes.  NECK: No JVD present. No thyromegaly noted. No carotid bruits  LYMPHATIC: No visible cervical adenopathy.  CHEST Normal respiratory effort. No  intercostal retractions  LUNGS: Clear to auscultation bilaterally.  No stridor. No wheezes. No rales.  CARDIOVASCULAR: Regular, positive S1-S2, no murmurs rubs or gallops appreciated. ABDOMINAL: Obese, soft, nontender, nondistended, positive bowel sounds all 4 quadrants no apparent ascites.  EXTREMITIES: No peripheral edema, 2+ DP and PT pulses, warm to touch. HEMATOLOGIC: No significant bruising NEUROLOGIC: Oriented to person, place, and time. Nonfocal. Normal muscle tone.  PSYCHIATRIC: Normal mood and affect. Normal behavior. Cooperative  CARDIAC DATABASE: EKG: 02/21/2021: Sinus  Rhythm, 75bpm, normal axis, without underlying injury pattern., QTc 437mec.    Echocardiogram: 03/30/2021: Normal LV systolic function with visual EF 60-65%. Left ventricle cavity is normal in size. Mild left ventricular hypertrophy. Normal global wall motion. Normal diastolic filling pattern, normal LAP. Mild tricuspid regurgitation. No evidence of pulmonary hypertension. No prior study for comparison.   Stress Testing: Exercise treadmill stress test 03/30/2021: Functional status: Poor.  Chest pain: No.  Reason for stopping exercise: Fatigue/weakness.  Hypertensive response to exercise: No.  Exercise time 7 minutes 05 seconds on Bruce protocol, achieved 8.7METS, 92% of age-predicted maximum heart rate.  Stress ECG positive for ischemia.   Abnormal GXT. Consider further cardiac work up if clinically indicated.  Heart Catheterization: None  14 day extended Holter monitor: Dominant rhythm normal sinus, followed by sinus tachycardia (33% burden). Heart rate 61-157 bpm. Avg HR 96 bpm. No atrial fibrillation, supraventricular tachycardia, ventricular tachycardia, high grade AV block, pauses (3 seconds or longer). Total ventricular ectopic burden <1%. Total supraventricular ectopic burden <1%. Patient triggered events: 12. Predominantly sinus tachycardia followed by normal sinus rhythm with rare PVCs.    CCTA 04/10/2021:  1. Total coronary calcium score of 0. 2. Normal coronary origin with right dominance. 3. CAD-RADS = 0.No evidence of CAD (0%).  4. No acute findings in the imaged extracardiac chest.  RECOMMENDATIONS: Consider non-atherosclerotic causes of chest pain.  LABORATORY DATA: CBC Latest Ref Rng & Units 02/21/2021 01/26/2020 06/06/2019  WBC 3.4 - 10.8 x10E3/uL 6.7 13.9(H) 3.5(L)  Hemoglobin 11.1 - 15.9 g/dL 12.1 12.9 12.3  Hematocrit 34.0 - 46.6 % 37.3 39.4 37.0  Platelets 150 - 450 x10E3/uL 287 381 214    CMP Latest Ref Rng & Units 04/09/2021 02/21/2021 01/26/2020  Glucose 70 - 99 mg/dL 88 82 106(H)  BUN 6 - 20 mg/dL _2 Creatinine 0.57 - 1.00 mg/dL 0.97 0.74 0.82  Sodium 134 - 144 mmol/L 140 138 139  Potassium 3.5 - 5.2 mmol/L 4.0 3.6 3.3(L)  Chloride 96 - 106 mmol/L 102 103 104  CO2 20 - 29 mmol/L 18(L) 18(L) 24  Calcium 8.7 - 10.2 mg/dL 9.7 9.8 9.8  Total Protein  6.5 - 8.1 g/dL - - 7.8  Total Bilirubin 0.3 - 1.2 mg/dL - - 0.5  Alkaline Phos 38 - 126 U/L - - 45  AST 15 - 41 U/L - - 14(L)  ALT 0 - 44 U/L - - 13    Lipid Panel  No results found for: CHOL, TRIG, HDL, CHOLHDL, VLDL, LDLCALC, LDLDIRECT, LABVLDL  No components found for: NTPROBNP Recent Labs    02/21/21 1140  PROBNP 104   No results for input(s): TSH in the last 8760 hours.  BMP Recent Labs    02/21/21 1142 04/09/21 0825  NA 138 140  K 3.6 4.0  CL 103 102  CO2 18* 18*  GLUCOSE 82 88  BUN 10 15  CREATININE 0.74 0.97  CALCIUM 9.8 9.7    HEMOGLOBIN A1C No results found for: HGBA1C, MPG  External Labs:  Date Collected: 01/27/2021 , information obtained by referring provider Potassium: 3.6 Creatinine 0.77 mg/dL. eGFR: 101 mL/min per 1.73 m Hemoglobin: 12.8 g/dL and hematocrit: 38.1 % AST: 11 , ALT: 9 , alkaline phosphatase: 46   Date Collected: 09/22/2020 , information obtained by referring provider Lipid profile: Total cholesterol 165 , triglycerides 146 , HDL 74 , LDL  68  Date Collected: 02/09/2021 , information obtained by referring provider TSH: 2.14    Date collected: 02/12/2021: D-dimer 0.23 (within normal limits).   IMPRESSION:    ICD-10-CM   1. Tachycardia  R00.0 DISCONTINUED: diltiazem (CARDIZEM) 30 MG tablet    2. Precordial pain  R07.2     3. Shortness of breath  R06.02     4. History of COVID-19  Z86.16     5. Former smoker  Z87.891     59. Class 3 severe obesity due to excess calories without serious comorbidity with body mass index (BMI) of 40.0 to 44.9 in adult [facility])  E66.01    Z68.41        RECOMMENDATIONS: Rita Nelson is a 40 y.o. female whose past medical history and cardiac risk factors include: Hx of COVID 19 infection x 3 (last July 2022), hypertriglyceridemia, depression, migraines, former smoker, obesity due to excess calories.  Tachycardia Patient's underlying palpitations most likely secondary to tachycardia. Patient's underlying tachycardia may be secondary to her recent history of COVID-19 infection x3, former smoker, socially does CBD vaping, deconditioning/obesity, etc. Patient was on Lopressor 50 mg p.o. twice daily for her tachycardia and coronary CTA. However, given her history of depression and her being on Zoloft as well as Wellbutrin the shared decision at today's visit was to discontinue metoprolol and transition her to Cardizem 30 mg p.o. twice daily Will reevaluate.  Precordial pain Currently chest pain-free. Coronary CTA: Total CAC 0, no evidence of epicardial coronary artery disease. Echo: LVEF 56-38%, normal diastolic filling pattern, no significant valvular heart disease Overall improving. Continue to monitor. No additional cardiovascular testing warranted at this time.  Shortness of breath I suspect that her shortness of breath is multifactorial for reasons mentioned above.  It is very reasonable to proceed with pulmonary evaluation as she has multiple noncardiac reasons to be short of  breath. NT proBNP within normal limits. Will follow peripherally.   Class 3 severe obesity due to excess calories without serious comorbidity with body mass index (BMI) of 40.0 to 44.9 in adult [facility]) Body mass index is 41.47 kg/m. I reviewed with the patient the importance of diet, regular physical activity/exercise, weight loss.   Patient is educated on increasing physical activity gradually as tolerated.  With the goal of moderate intensity exercise for 30 minutes a day 5 days a week.  FINAL MEDICATION LIST END OF ENCOUNTER: Meds ordered this encounter  Medications   DISCONTD: diltiazem (CARDIZEM) 30 MG tablet    Sig: Take 1 tablet (30 mg total) by mouth 2 (two) times daily.    Dispense:  60 tablet    Refill:  0     Current Outpatient Medications:    aspirin-acetaminophen-caffeine (EXCEDRIN MIGRAINE) 250-250-65 MG tablet, Take 1 tablet by mouth every 6 (six) hours as needed for headache or migraine., Disp: , Rfl:    Biotin 5000 MCG CAPS, Take 1 capsule by mouth daily at 12 noon., Disp: , Rfl:    buPROPion (WELLBUTRIN XL) 300 MG 24 hr tablet, Take 300 mg by mouth every evening. , Disp: , Rfl: 3   Cranberry-Vitamin C-Vitamin E (CRANBERRY PLUS VITAMIN C PO), Take 2 capsules by mouth daily., Disp: , Rfl:    gemfibrozil (LOPID) 600 MG tablet, Take 600 mg by mouth 2 (two) times daily., Disp: , Rfl:    hydrochlorothiazide (HYDRODIURIL) 12.5 MG tablet, Take 12.5 mg by mouth daily., Disp: , Rfl:    ibuprofen (ADVIL,MOTRIN) 800 MG tablet, Take 1 tablet (800 mg total) by mouth every 8 (eight) hours as needed., Disp: 21 tablet, Rfl: 0   levocetirizine (XYZAL) 5 MG tablet, Take 5 mg by mouth every evening., Disp: , Rfl:    linaclotide (LINZESS) 290 MCG CAPS capsule, Take 290 mcg by mouth daily before breakfast., Disp: , Rfl:    montelukast (SINGULAIR) 10 MG tablet, , Disp: , Rfl: 2   Norethindrone Acetate-Ethinyl Estrad-FE (JUNEL FE 24) 1-20 MG-MCG(24) tablet, Take 1 tablet by mouth daily., Disp:  , Rfl:    Omega-3 Fatty Acids (FISH OIL) 1000 MG CAPS, Take 1 capsule by mouth every evening., Disp: , Rfl:    ondansetron (ZOFRAN) 8 MG tablet, Take 4 mg by mouth every 8 (eight) hours as needed for nausea or vomiting., Disp: , Rfl:    OVER THE COUNTER MEDICATION, Take 1 capsule by mouth daily as needed. Stacker 3 XPLC, Disp: , Rfl:    OVER THE COUNTER MEDICATION, Take 10 mg by mouth daily as needed. Sudaphed, Disp: , Rfl:    pantoprazole (PROTONIX) 40 MG tablet, Take 40 mg by mouth daily., Disp: , Rfl:    Rimegepant Sulfate (NURTEC) 75 MG TBDP, Take 75 mg by mouth as needed., Disp: , Rfl:    rOPINIRole (REQUIP) 1 MG tablet, Take 1 mg by mouth daily. , Disp: , Rfl:    sertraline (ZOLOFT) 25 MG tablet, sertraline 25 mg tablet  Take 1 tablet every day by oral route for 14 days., Disp: , Rfl:    topiramate (TOPAMAX) 100 MG tablet, Take 100 mg by mouth daily., Disp: , Rfl:    diltiazem (CARDIZEM) 30 MG tablet, TAKE 1 TABLET(30 MG) BY MOUTH TWICE DAILY, Disp: 180 tablet, Rfl: 3  No orders of the defined types were placed in this encounter.   There are no Patient Instructions on file for this visit.   --Continue cardiac medications as reconciled in final medication list. --Return in about 3 months (around 08/07/2021) for Follow up, Palpitations. Or sooner if needed. --Continue follow-up with your primary care physician regarding the management of your other chronic comorbid conditions.  Patient's questions and concerns were addressed to her satisfaction. She voices understanding of the instructions provided during this encounter.   This note was created using a voice recognition software as  a result there may be grammatical errors inadvertently enclosed that do not reflect the nature of this encounter. Every attempt is made to correct such errors.  Rita Nelson, [place], [facility]  Pager: [phone] Office: [phone]

## 2021-05-11 ENCOUNTER — Telehealth: Payer: Self-pay | Admitting: Pulmonary Disease

## 2021-05-11 ENCOUNTER — Other Ambulatory Visit: Payer: Self-pay

## 2021-05-11 ENCOUNTER — Ambulatory Visit: Payer: BC Managed Care – PPO | Admitting: Pulmonary Disease

## 2021-05-11 ENCOUNTER — Encounter: Payer: Self-pay | Admitting: Pulmonary Disease

## 2021-05-11 VITALS — BP 110/84 | HR 101 | Temp 98.0°F | Ht 66.14 in | Wt 248.4 lb

## 2021-05-11 DIAGNOSIS — Z23 Encounter for immunization: Secondary | ICD-10-CM | POA: Diagnosis not present

## 2021-05-11 DIAGNOSIS — R0609 Other forms of dyspnea: Secondary | ICD-10-CM | POA: Diagnosis not present

## 2021-05-11 MED ORDER — FLUTICASONE FUROATE-VILANTEROL 200-25 MCG/ACT IN AEPB: 1.0000 | INHALATION_SPRAY | Freq: Every day | RESPIRATORY_TRACT | 3 refills | Status: DC

## 2021-05-11 MED ORDER — ALBUTEROL SULFATE HFA 108 (90 BASE) MCG/ACT IN AERS: 2.0000 | INHALATION_SPRAY | Freq: Four times a day (QID) | RESPIRATORY_TRACT | 6 refills | Status: DC | PRN

## 2021-05-11 NOTE — Patient Instructions (Signed)
Nice to meet you  Use Breo 1 puff daily, rinse your mouth out after every use.  If the co-pay is too high, please ask the pharmacist what the preferred option is and let us know and I will prescribe that.  Use albuterol 2 puffs every 6 hours as needed.  We will get pulmonary function test to help further evaluate your symptoms.  PFTs next available  Please call me if I can help in any way, with any concerns you may have  Return to clinic in 3 months or sooner as needed with Dr. Judeth Horn

## 2021-05-11 NOTE — Telephone Encounter (Signed)
Will send over to the pharmacy to find out on other alternatives that may be more cost effective for the pt.  thanks

## 2021-05-11 NOTE — Progress Notes (Signed)
'@Patient'  ID: Rita Nelson, female    DOB: 01/01/1981, 40 y.o.   MRN: [id number]  Chief Complaint  Patient presents with   Consult    DOE Flu vaccine today    Referring provider: Everardo Beals, NP  HPI:   40 y.o. woman whom we are seeing in consultation for evaluation of dyspnea on exertion.  Note from referring provider reviewed.  Most recent cardiology note x2 reviewed.  Patient reports shortness of breath for several months.  Worsening after multiple COVID infections.  However she thinks worsened after most recent infection July 2022.  She describes dyspnea on exertion worse on inclines or stairs.  Present on flat surfaces over longer distances as well.  A bit worse in the evening.  No other time of day when things are better or worse.  No position make things better or worse.  No seasonal environmental factors she can identify that make things better or worse.  No alleviating or exacerbating factors.  She does endorse a history of seasonal allergies.  Takes montelukast.  Currently being followed by cardiology.  Long-term heart monitor was relatively normal on my review.  Had exercise stress test that was concerning for ischemia, results reviewed.  Echocardiogram reviewed which showed normal EF, normal diastolic filling pattern, mild TR.  Awaiting results of CT coronary scan.  Reviewed images, lungs appear clear although limited has not entirety of lung seen.  Most recent chest x-ray 12/2019 reviewed and interpreted as clear lungs bilaterally.  PMH: Seasonal allergies, GERD Surgical history: C-section, laparoscopy Family history: Father with hypertension, diabetes, mother with hypertension Social history: Former smoker, quit 2015, 15-pack-year history, lives in [place] / Pulmonary Flowsheets:   ACT:  No flowsheet data found.  MMRC: No flowsheet data found.  Epworth:  No flowsheet data found.  Tests:   FENO:  No results found for:  NITRICOXIDE  PFT: PFT Results Latest Ref Rng & Units 06/01/2021  FVC-Pre L 3.98  FVC-Predicted Pre % 103  FVC-Post L 4.04  FVC-Predicted Post % 105  Pre FEV1/FVC % % 84  Post FEV1/FCV % % 88  FEV1-Pre L 3.33  FEV1-Predicted Pre % 106  FEV1-Post L 3.54  DLCO uncorrected ml/min/mmHg 25.48  DLCO UNC% % 112  DLCO corrected ml/min/mmHg 25.48  DLCO COR %Predicted % 112  DLVA Predicted % 113  TLC L 5.66  TLC % Predicted % 108  RV % Predicted % 109    WALK:  No flowsheet data found.  Imaging: Personally reviewed and as per EMR and discussion this note  Lab Results: Personally reviewed CBC    Component Value Date/Time   WBC 6.7 02/21/2021 1142   WBC 13.9 (H) 01/26/2020 1156   RBC 4.23 02/21/2021 1142   RBC 4.60 01/26/2020 1156   HGB 12.1 02/21/2021 1142   HCT 37.3 02/21/2021 1142   PLT 287 02/21/2021 1142   MCV 88 02/21/2021 1142   MCH 28.6 02/21/2021 1142   MCH 28.0 01/26/2020 1156   MCHC 32.4 02/21/2021 1142   MCHC 32.7 01/26/2020 1156   RDW 13.5 02/21/2021 1142   LYMPHSABS 3.0 02/21/2021 1142   MONOABS 0.4 06/06/2019 0545   EOSABS 0.2 02/21/2021 1142   BASOSABS 0.0 02/21/2021 1142    BMET    Component Value Date/Time   NA 140 04/09/2021 0825   K 4.0 04/09/2021 0825   CL 102 04/09/2021 0825   CO2 18 (L) 04/09/2021 0825   GLUCOSE 88 04/09/2021 0825   GLUCOSE 106 (H) 01/26/2020  1156   BUN 15 04/09/2021 0825   CREATININE 0.97 04/09/2021 0825   CALCIUM 9.7 04/09/2021 0825   GFRNONAA >60 01/26/2020 1156   GFRAA >60 01/26/2020 1156    BNP No results found for: BNP  ProBNP    Component Value Date/Time   PROBNP 104 02/21/2021 1140    Specialty Problems       Pulmonary Problems   Perennial allergic rhinitis   COVID-19 long hauler manifesting chronic dyspnea    Allergies  Allergen Reactions   Sulfa Antibiotics Other (See Comments)   Sulfamethoxazole-Trimethoprim Other (See Comments)    Immunization History  Administered Date(s) Administered    Influenza,inj,Quad PF,6+ Mos 05/11/2021   MMR 04/25/2016    Past Medical History:  Diagnosis Date   Anxiety    Depression    Diverticulosis    E. coli infection    GERD (gastroesophageal reflux disease)    Gram-negative infection    Hyperlipidemia    Migraine    Seasonal allergies    Staph infection     Tobacco History: Social History   Tobacco Use  Smoking Status Former   Packs/day: 1.00   Years: 15.00   Pack years: 15.00   Types: Cigarettes   Quit date: 03/03/2014   Years since quitting: 7.5  Smokeless Tobacco Never   Counseling given: Not Answered   Continue to not smoke  Outpatient Encounter Medications as of 05/11/2021  Medication Sig   aspirin-acetaminophen-caffeine (EXCEDRIN MIGRAINE) 250-250-65 MG tablet Take 1 tablet by mouth every 6 (six) hours as needed for headache or migraine.   Biotin 5000 MCG CAPS Take 1 capsule by mouth daily at 12 noon.   buPROPion (WELLBUTRIN XL) 300 MG 24 hr tablet Take 300 mg by mouth every evening.    Cranberry-Vitamin C-Vitamin E (CRANBERRY PLUS VITAMIN C PO) Take 2 capsules by mouth daily.   diltiazem (CARDIZEM) 30 MG tablet TAKE 1 TABLET(30 MG) BY MOUTH TWICE DAILY   gemfibrozil (LOPID) 600 MG tablet Take 600 mg by mouth 2 (two) times daily.   hydrochlorothiazide (HYDRODIURIL) 12.5 MG tablet Take 12.5 mg by mouth daily.   levocetirizine (XYZAL) 5 MG tablet Take 5 mg by mouth every evening.   montelukast (SINGULAIR) 10 MG tablet    Norethindrone Acetate-Ethinyl Estrad-FE (JUNEL FE 24) 1-20 MG-MCG(24) tablet Take 1 tablet by mouth daily.   Omega-3 Fatty Acids (FISH OIL) 1000 MG CAPS Take 1 capsule by mouth every evening.   ondansetron (ZOFRAN) 8 MG tablet Take 4 mg by mouth every 8 (eight) hours as needed for nausea or vomiting.   OVER THE COUNTER MEDICATION Take 1 capsule by mouth daily as needed. Stacker 3 XPLC   OVER THE COUNTER MEDICATION Take 10 mg by mouth daily as needed. Sudaphed   pantoprazole (PROTONIX) 40 MG tablet  Take 40 mg by mouth daily.   Rimegepant Sulfate (NURTEC) 75 MG TBDP Take 75 mg by mouth as needed.   rOPINIRole (REQUIP) 1 MG tablet Take 1 mg by mouth daily.    topiramate (TOPAMAX) 100 MG tablet Take 100 mg by mouth daily.   [DISCONTINUED] albuterol (VENTOLIN HFA) 108 (90 Base) MCG/ACT inhaler Inhale 2 puffs into the lungs every 6 (six) hours as needed for wheezing or shortness of breath.   [DISCONTINUED] fluticasone furoate-vilanterol (BREO ELLIPTA) 200-25 MCG/ACT AEPB Inhale 1 puff into the lungs daily. (Patient not taking: Reported on 06/01/2021)   [DISCONTINUED] linaclotide (LINZESS) 290 MCG CAPS capsule Take 290 mcg by mouth daily before breakfast.   [DISCONTINUED]  sertraline (ZOLOFT) 25 MG tablet sertraline 25 mg tablet  Take 1 tablet every day by oral route for 14 days.   [DISCONTINUED] ibuprofen (ADVIL,MOTRIN) 800 MG tablet Take 1 tablet (800 mg total) by mouth every 8 (eight) hours as needed.   [DISCONTINUED] omeprazole (PRILOSEC) 20 MG capsule Take 20 mg by mouth 2 (two) times daily as needed (heartburn).    No facility-administered encounter medications on file as of 05/11/2021.     Review of Systems  Review of Systems  No chest pain with exertion.  No orthopnea or PND.  Comprehensive review of systems otherwise negative. Physical Exam  BP 110/84 (BP Location: Left Arm, Cuff Size: Large)   Pulse (!) 101   Temp 98 F (36.7 C) (Oral)   Ht 5' 6.14" (1.68 m)   Wt 248 lb 6.4 oz (112.7 kg)   SpO2 97%   BMI 39.92 kg/m   Wt Readings from Last 5 Encounters:  06/04/21 249 lb 1.9 oz (113 kg)  06/01/21 250 lb 12.8 oz (113.8 kg)  05/11/21 248 lb 6.4 oz (112.7 kg)  05/07/21 249 lb 3.2 oz (113 kg)  05/04/21 249 lb (112.9 kg)    BMI Readings from Last 5 Encounters:  06/04/21 41.46 kg/m  06/01/21 41.74 kg/m  05/11/21 39.92 kg/m  05/07/21 41.47 kg/m  05/04/21 41.44 kg/m     Physical Exam General: Sitting in chair, no acute distress Eyes: EOMI, no icterus Neck:  Supple, no JVP Pulmonary: Clear, normal work of breathing Cardiovascular: Regular in rhythm, no murmur Abdomen: Nondistended, bowel sounds present MSK: No synovitis, no joint effusion Neuro: Normal gait, no weakness Psych: Normal mood, full affect   Assessment & Plan:   Dyspnea on exertion: Following viral infection.  Concern for possible unmasking underlying asthma.  Deconditioning likely contributing.  PFTs for further evaluation  Asthma/reactive airway disease: Based on worsening dyspnea after viral infection.  History of seasonal allergies.  Breo 1 puff daily.  Albuterol as needed.  Both prescribed today.  Healthcare maintenance: Flu vaccine today.   Return in about 3 months (around 08/11/2021).   Lanier Clam, MD 09/03/2021

## 2021-05-14 NOTE — Telephone Encounter (Signed)
MH please advise. thanks 

## 2021-05-14 NOTE — Telephone Encounter (Signed)
Breo and Ventolin/Albuterol both need a PA which is why they are more expensive. Virgel Bouquet is an ICS+LABA medication. In that class Dulera $35, Symbicort $35, Adviar Diskus $10, Wixela needs PA. Please advise

## 2021-05-15 MED ORDER — ALBUTEROL SULFATE HFA 108 (90 BASE) MCG/ACT IN AERS: 1.0000 | INHALATION_SPRAY | Freq: Four times a day (QID) | RESPIRATORY_TRACT | 5 refills | Status: AC | PRN

## 2021-05-15 MED ORDER — FLUTICASONE-SALMETEROL 500-50 MCG/ACT IN AEPB
1.0000 | INHALATION_SPRAY | Freq: Two times a day (BID) | RESPIRATORY_TRACT | 6 refills | Status: AC
Start: 2021-05-15 — End: ?

## 2021-05-15 NOTE — Telephone Encounter (Signed)
New medications have been sent to the pharmacy per pts request.

## 2021-05-15 NOTE — Telephone Encounter (Signed)
Advair Diskus 500-50 1 puff BID. Any other albuterol inhaler is fine. 2 puffs q6 hrs PRN SOB, wheeze. Please send to preferred pharmacy. Thanks!

## 2021-05-18 ENCOUNTER — Telehealth: Payer: Self-pay | Admitting: Internal Medicine

## 2021-05-18 MED ORDER — LINACLOTIDE 290 MCG PO CAPS: 290.0000 ug | ORAL_CAPSULE | Freq: Every day | ORAL | 3 refills | Status: DC

## 2021-05-18 NOTE — Telephone Encounter (Signed)
Linzess 290 mcg sent to pharmacy. 

## 2021-05-18 NOTE — Telephone Encounter (Signed)
Patient called stating she did want a prescription for Linzess called into the [facility] on [address].  Thank you.

## 2021-05-29 ENCOUNTER — Telehealth: Payer: Self-pay | Admitting: Pulmonary Disease

## 2021-05-29 NOTE — Telephone Encounter (Signed)
ATC patient, LMTCB 

## 2021-05-30 NOTE — Telephone Encounter (Signed)
I called patient and left a message to call back about note below.

## 2021-05-31 ENCOUNTER — Telehealth: Payer: Self-pay | Admitting: Internal Medicine

## 2021-05-31 NOTE — Telephone Encounter (Signed)
Patient called and said her insurance won't pay for the Linzess and that her out of pocket was close to $1700.  Her pharmacist suggested Trulance and suggested that her insurance might cover it.  If it doesn't, she will definitely need something else.  Please call patient and let her know.  Thank you.

## 2021-05-31 NOTE — Telephone Encounter (Signed)
Patient is returning phone call. Patient phone number is [phone].

## 2021-05-31 NOTE — Telephone Encounter (Signed)
Is switching patient to Trulance a possibility?  Please advise.

## 2021-05-31 NOTE — Telephone Encounter (Signed)
1.  Just curious to Linzess work? 2.  Can try Trulance 3 mg once daily.  Okay to prescribe.  Thanks

## 2021-05-31 NOTE — Telephone Encounter (Signed)
Called and spoke with patient, she reports sob with minimal exertion, even with laying down and sleeping.  She reports chest discomfort and had an EKG and had sats checked and they ranged from 91%-93%.  Patient is scheduled for PFT at 12 pm on 12/2, scheduled to see App at 2 pm for f/u and evaluation of symptoms.  App made aware, nothing further needed.

## 2021-06-01 ENCOUNTER — Encounter: Payer: Self-pay | Admitting: Nurse Practitioner

## 2021-06-01 ENCOUNTER — Ambulatory Visit (INDEPENDENT_AMBULATORY_CARE_PROVIDER_SITE_OTHER): Payer: BC Managed Care – PPO | Admitting: Pulmonary Disease

## 2021-06-01 ENCOUNTER — Ambulatory Visit: Payer: BC Managed Care – PPO | Admitting: Nurse Practitioner

## 2021-06-01 ENCOUNTER — Other Ambulatory Visit: Payer: Self-pay

## 2021-06-01 VITALS — BP 114/70 | HR 94 | Temp 98.3°F | Ht 65.0 in | Wt 250.8 lb

## 2021-06-01 DIAGNOSIS — U099 Post covid-19 condition, unspecified: Secondary | ICD-10-CM | POA: Diagnosis not present

## 2021-06-01 DIAGNOSIS — J3089 Other allergic rhinitis: Secondary | ICD-10-CM

## 2021-06-01 DIAGNOSIS — R918 Other nonspecific abnormal finding of lung field: Secondary | ICD-10-CM | POA: Diagnosis not present

## 2021-06-01 DIAGNOSIS — R0609 Other forms of dyspnea: Secondary | ICD-10-CM

## 2021-06-01 LAB — PULMONARY FUNCTION TEST
DL/VA % pred: 113 %
DL/VA: 5.01 ml/min/mmHg/L
DLCO cor % pred: 112 %
DLCO cor: 25.48 ml/min/mmHg
DLCO unc % pred: 112 %
DLCO unc: 25.48 ml/min/mmHg
FEF 25-75 Post: 4.26 L/sec
FEF 25-75 Pre: 3.48 L/sec
FEF2575-%Change-Post: 22 %
FEF2575-%Pred-Post: 132 %
FEF2575-%Pred-Pre: 108 %
FEV1-%Change-Post: 6 %
FEV1-%Pred-Post: 113 %
FEV1-%Pred-Pre: 106 %
FEV1-Post: 3.54 L
FEV1-Pre: 3.33 L
FEV1FVC-%Change-Post: 4 %
FEV1FVC-%Pred-Pre: 101 %
FEV6-%Change-Post: 2 %
FEV6-%Pred-Post: 107 %
FEV6-%Pred-Pre: 104 %
FEV6-Post: 4.04 L
FEV6-Pre: 3.95 L
FEV6FVC-%Pred-Post: 101 %
FEV6FVC-%Pred-Pre: 101 %
FVC-%Change-Post: 1 %
FVC-%Pred-Post: 105 %
FVC-%Pred-Pre: 103 %
FVC-Post: 4.04 L
FVC-Pre: 3.98 L
Post FEV1/FVC ratio: 88 %
Post FEV6/FVC ratio: 100 %
Pre FEV1/FVC ratio: 84 %
Pre FEV6/FVC Ratio: 100 %
RV % pred: 109 %
RV: 1.78 L
TLC % pred: 108 %
TLC: 5.66 L

## 2021-06-01 MED ORDER — TRIAMCINOLONE ACETONIDE 55 MCG/ACT NA AERO
1.0000 | INHALATION_SPRAY | Freq: Every day | NASAL | 12 refills | Status: AC
Start: 2021-06-01 — End: ?

## 2021-06-01 MED ORDER — TRULANCE 3 MG PO TABS: 1.0000 | ORAL_TABLET | Freq: Every day | ORAL | 3 refills | Status: DC

## 2021-06-01 NOTE — Assessment & Plan Note (Signed)
Persistent rhinitis despite optimal therapy. Saline nasal spray and rinses advised. Previous workup by allergy/asthma positive for cat/dog dander. ENT referral for further eval. See above plan.

## 2021-06-01 NOTE — Telephone Encounter (Signed)
Patient called back.  Pharmacy called her and the Trulance will need a prior authorization.  Thank you.

## 2021-06-01 NOTE — Assessment & Plan Note (Addendum)
Initial dx in 06/2019. Persistent SOB upon exertion since. Has had COVID twice since, most recently in July 2022. Cardiac workup negative aside from tachycardia - placed on diltiazem. PFTs with normal lung function, volumes, and diffusion capacity. Some irregularities to flow loop but overall normal appearance. FeNO today 11. CT chest to evaluate for underlying etiology and re-evaluate nodules from 06/2019. Cardiopulmonary rehab referral. We did discuss weight and how obesity can worsen these symptoms - exercise encouraged.  Patient Instructions  Continue Advair inhaler 1 puff Twice daily, rinse after Continue albuterol inhaler 1-2 puffs every 6 hours as needed for shortness of breath or wheezing or prior to exercise Continue nasocort nasal spray 1 spray each nostril daily Continue Xyzal 5 mg At bedtime  Continue Singulair 10 mg At bedtime   Saline nasal spray 2-3 times a day  Healthy weight management discussed   CT chest ordered. They will contact you for scheduling. This is to reevaluate the previously identified nodules on your last CT chest.   FeNO today was 11.  Cardiopulmonary rehab referral sent.   ENT referral sent.   Follow up with cardiology as scheduled.   PFTs are normal today. This could potentially be COVID long hauler syndrome. No signs of asthma, COPD, or restriction on your pulmonary function tests.   Follow up in 3 months with Dr. Judeth Horn or APP. If symptoms do not improve or worsen, please contact office for sooner follow up or seek emergency care. p

## 2021-06-01 NOTE — Progress Notes (Signed)
Full PFT completed today ? ?

## 2021-06-01 NOTE — Telephone Encounter (Signed)
Lm on vm that I sent Trulance to patient's pharmacy

## 2021-06-01 NOTE — Progress Notes (Signed)
'@Patient'  ID: Rita Nelson, female    DOB: [birth year], 39 y.o.   MRN: [id number]  Chief Complaint  Patient presents with   Follow-up    F/u after PFT.  Increased sob, walking short distances, laying down.    Referring provider: Everardo Beals, NP  HPI: 40 year old female, former smoker (15-pack-year history) followed for dyspnea upon exertion.  She is a patient of Dr. Kavin Leech and was last seen in office on 05/11/2021.  Past medical history significant for allergic rhinitis, mixed anxiety and depressive disorder, pericardial pain, tachycardia, hypertriglyceridemia, obesity, COVID 19 virus x3.  She is followed by cardiology and asthma/allergy.  TEST/EVENTS:  05/05/2018 allergen profile: +Cat, dog dander 06/2019 CTA: Ill-defined nodular opacities with surrounding groundglass attenuation and scattered throughout both lungs.  Nodule in the left upper lobe measures 1.4 cm, nodule in the right lower lobe measures 1.4 cm, left lower lobe subpleural nodule measures 2.1 cm.  Nonspecific and may reflect inflammatory or infectious process.  metastatic disease is less favorable.  Follow-up imaging advised. no PE identified 03/20/2021 Holter monitor: Sinus rhythm with sinus tachycardia; tachycardia burden 33%. 03/30/2021 cardiac stress test: Poor functional capacity, stress EKG showed ischemia 03/30/2021 echocardiogram: LVEF 60 to 65%, normal in size.  Mild LVH.  Normal diastolic filling pattern.  Mild tricuspid regurg.  No evidence of pulmonary hypertension. 04/11/2021 coronary CT scan: Total calcium score 0 with no evidence of CAD  05/11/2021: OV with Dr. Silas Flood.  Breo 1 puff daily ordered.  Albuterol as needed.  PFTs scheduled with follow-up after.  06/01/2021: Today - follow up after PFTs Patient presents today for follow up after PFTs. Her PFTs were normal with normal lung function, volume, and diffusion capacity. There were some irregularities in her flow loops but overall, relatively  normal appearance. She reports persistent shortness of breath upon exertion with minimal relief from the [facility] inhaler. She is having to switch to Advair from [place] due to insurance coverage, but is finishing what [employer] she has left and then making the switch. Her dyspnea originated after her initial COVID infection in December of 2020. Since, she has had COVID two other times, as recently as July 2022. She feels as though this limits her in what she can do throughout the day. Her cardiac workup was relatively unremarkable, with the only finding being an occasional sinus tachycardia. She was placed on metoprolol and then switched to diltiazem for this. She denies a correlation between her shortness of breath and heart palpitations/tachycardia. She has an occasional non productive cough, clear rhinorrhea, and postnasal drip. She continues on Xyzal, singulair, nasocort, and pantoprazole daily. She occasionally uses her albuterol inhaler.   FeNO: 11  Allergies  Allergen Reactions   Sulfa Antibiotics Other (See Comments)   Sulfamethoxazole-Trimethoprim Other (See Comments)    Immunization History  Administered Date(s) Administered   Influenza,inj,Quad PF,6+ Mos 05/11/2021   MMR 04/25/2016    Past Medical History:  Diagnosis Date   Anxiety    Depression    Diverticulosis    E. coli infection    GERD (gastroesophageal reflux disease)    Gram-negative infection    Hyperlipidemia    Migraine    Seasonal allergies    Staph infection     Tobacco History: Social History   Tobacco Use  Smoking Status Former   Packs/day: 1.00   Years: 15.00   Pack years: 15.00   Types: Cigarettes   Quit date: 03/03/2014   Years since quitting: 7.2  Smokeless Tobacco Never  Counseling given: Not Answered   Outpatient Medications Prior to Visit  Medication Sig Dispense Refill   albuterol (PROAIR HFA) 108 (90 Base) MCG/ACT inhaler Inhale 1-2 puffs into the lungs every 6 (six) hours as needed for wheezing  or shortness of breath. 18 g 5   aspirin-acetaminophen-caffeine (EXCEDRIN MIGRAINE) 250-250-65 MG tablet Take 1 tablet by mouth every 6 (six) hours as needed for headache or migraine.     Biotin 5000 MCG CAPS Take 1 capsule by mouth daily at 12 noon.     buPROPion (WELLBUTRIN XL) 300 MG 24 hr tablet Take 300 mg by mouth every evening.   3   Cranberry-Vitamin C-Vitamin E (CRANBERRY PLUS VITAMIN C PO) Take 2 capsules by mouth daily.     diltiazem (CARDIZEM) 30 MG tablet TAKE 1 TABLET(30 MG) BY MOUTH TWICE DAILY 180 tablet 3   fluticasone-salmeterol (ADVAIR DISKUS) 500-50 MCG/ACT AEPB Inhale 1 puff into the lungs in the morning and at bedtime. 60 each 6   gemfibrozil (LOPID) 600 MG tablet Take 600 mg by mouth 2 (two) times daily.     hydrochlorothiazide (HYDRODIURIL) 12.5 MG tablet Take 12.5 mg by mouth daily.     levocetirizine (XYZAL) 5 MG tablet Take 5 mg by mouth every evening.     montelukast (SINGULAIR) 10 MG tablet   2   Norethindrone Acetate-Ethinyl Estrad-FE (JUNEL FE 24) 1-20 MG-MCG(24) tablet Take 1 tablet by mouth daily.     Omega-3 Fatty Acids (FISH OIL) 1000 MG CAPS Take 1 capsule by mouth every evening.     ondansetron (ZOFRAN) 8 MG tablet Take 4 mg by mouth every 8 (eight) hours as needed for nausea or vomiting.     OVER THE COUNTER MEDICATION Take 1 capsule by mouth daily as needed. Stacker 3 XPLC     OVER THE COUNTER MEDICATION Take 10 mg by mouth daily as needed. Sudaphed     pantoprazole (PROTONIX) 40 MG tablet Take 40 mg by mouth daily.     Plecanatide (TRULANCE) 3 MG TABS Take 1 tablet by mouth daily. 30 tablet 3   Rimegepant Sulfate (NURTEC) 75 MG TBDP Take 75 mg by mouth as needed.     rOPINIRole (REQUIP) 1 MG tablet Take 1 mg by mouth daily.      sertraline (ZOLOFT) 50 MG tablet Take 50 mg by mouth daily.     topiramate (TOPAMAX) 100 MG tablet Take 100 mg by mouth daily.     fluticasone furoate-vilanterol (BREO ELLIPTA) 200-25 MCG/ACT AEPB Inhale 1 puff into the lungs  daily. (Patient not taking: Reported on 06/01/2021) 60 each 3   linaclotide (LINZESS) 290 MCG CAPS capsule Take 1 capsule (290 mcg total) by mouth daily before breakfast. 90 capsule 3   metoprolol tartrate (LOPRESSOR) 50 MG tablet Take 50 mg by mouth daily.     No facility-administered medications prior to visit.     Review of Systems:   Constitutional: No weight loss or gain, night sweats, fevers, chills, fatigue, or lassitude. HEENT: No headaches, difficulty swallowing, tooth/dental problems, or sore throat. No sneezing, itching, ear ache. +Chronic sinusitis with nasal congestion and post nasal drip. CV:  No chest pain, orthopnea, PND, swelling in lower extremities, anasarca, dizziness, syncope. +palpitations (resolved with diltiazem) Resp: +shortness of breath with exertion, non-productive cough. No excess mucus or change in color of mucus. No hemoptysis. No wheezing.  No chest wall deformity GI:  No heartburn, indigestion, abdominal pain, nausea, vomiting, diarrhea, change in bowel habits, loss of appetite, bloody  stools.  GU: No dysuria, change in color of urine, urgency or frequency.  No flank pain, no hematuria  Skin: No rash, lesions, ulcerations MSK:  No joint pain or swelling.  No decreased range of motion.  No back pain. Neuro: No dizziness or lightheadedness.  Psych: No depression or anxiety. Mood stable.     Physical Exam:  BP 114/70 (BP Location: Right Arm, Patient Position: Sitting, Cuff Size: Large)   Pulse 94   Temp 98.3 F (36.8 C) (Oral)   Ht '5\' 5"'  (1.651 m)   Wt 250 lb 12.8 oz (113.8 kg)   SpO2 97%   BMI 41.74 kg/m   GEN: Pleasant, interactive; morbidly obese; in no acute distress. HEENT:  Normocephalic and atraumatic. EACs patent bilaterally. TM pearly gray with present light reflex bilaterally. PERRLA. Sclera white. Nasal turbinates pink, moist and patent bilaterally. No rhinorrhea present. Oropharynx erythematous and moist, without exudate or edema. No  lesions, ulcerations NECK:  Supple w/ fair ROM. No JVD present. Normal carotid impulses w/o bruits. Thyroid symmetrical with no goiter or nodules palpated. No lymphadenopathy.   CV: RRR, no m/r/g, no peripheral edema. Pulses intact, +2 bilaterally. No cyanosis, pallor or clubbing. PULMONARY:  Unlabored, regular breathing. Clear bilaterally A&P w/o wheezes/rales/rhonchi. No accessory muscle use. No dullness to percussion. GI: BS present and normoactive. Soft, non-tender to palpation. No organomegaly or masses detected. No CVA tenderness. MSK: No erythema, warmth or tenderness. Cap refil <2 sec all extrem. No deformities or joint swelling noted.  Neuro: A/Ox3. No focal deficits noted.   Skin: Warm, no lesions or rashe Psych: Normal affect and behavior. Judgement and thought content appropriate.     Lab Results:  CBC    Component Value Date/Time   WBC 6.7 02/21/2021 1142   WBC 13.9 (H) 01/26/2020 1156   RBC 4.23 02/21/2021 1142   RBC 4.60 01/26/2020 1156   HGB 12.1 02/21/2021 1142   HCT 37.3 02/21/2021 1142   PLT 287 02/21/2021 1142   MCV 88 02/21/2021 1142   MCH 28.6 02/21/2021 1142   MCH 28.0 01/26/2020 1156   MCHC 32.4 02/21/2021 1142   MCHC 32.7 01/26/2020 1156   RDW 13.5 02/21/2021 1142   LYMPHSABS 3.0 02/21/2021 1142   MONOABS 0.4 06/06/2019 0545   EOSABS 0.2 02/21/2021 1142   BASOSABS 0.0 02/21/2021 1142    BMET    Component Value Date/Time   NA 140 04/09/2021 0825   K 4.0 04/09/2021 0825   CL 102 04/09/2021 0825   CO2 18 (L) 04/09/2021 0825   GLUCOSE 88 04/09/2021 0825   GLUCOSE 106 (H) 01/26/2020 1156   BUN 15 04/09/2021 0825   CREATININE 0.97 04/09/2021 0825   CALCIUM 9.7 04/09/2021 0825   GFRNONAA >60 01/26/2020 1156   GFRAA >60 01/26/2020 1156    BNP No results found for: BNP   Imaging:  No results found.    PFT Results Latest Ref Rng & Units 06/01/2021  FVC-Pre L 3.98  FVC-Predicted Pre % 103  FVC-Post L 4.04  FVC-Predicted Post % 105  Pre  FEV1/FVC % % 84  Post FEV1/FCV % % 88  FEV1-Pre L 3.33  FEV1-Predicted Pre % 106  FEV1-Post L 3.54  DLCO uncorrected ml/min/mmHg 25.48  DLCO UNC% % 112  DLCO corrected ml/min/mmHg 25.48  DLCO COR %Predicted % 112  DLVA Predicted % 113  TLC L 5.66  TLC % Predicted % 108  RV % Predicted % 109    No results found for: NITRICOXIDE  Assessment & Plan:   COVID-19 long hauler manifesting chronic dyspnea Initial dx in 06/2019. Persistent SOB upon exertion since. Has had COVID twice since, most recently in July 2022. Cardiac workup negative aside from tachycardia - placed on diltiazem. PFTs with normal lung function, volumes, and diffusion capacity. Some irregularities to flow loop but overall normal appearance. FeNO today 11. CT chest to evaluate for underlying etiology and re-evaluate nodules from 06/2019. Cardiopulmonary rehab referral.   Patient Instructions  Continue Advair inhaler 1 puff Twice daily, rinse after Continue albuterol inhaler 1-2 puffs every 6 hours as needed for shortness of breath or wheezing or prior to exercise Continue nasocort nasal spray 1 spray each nostril daily Continue Xyzal 5 mg At bedtime  Continue Singulair 10 mg At bedtime   Saline nasal spray 2-3 times a day  Healthy weight management discussed   CT chest ordered. They will contact you for scheduling. This is to reevaluate the previously identified nodules on your last CT chest.   FeNO today was 11.  Cardiopulmonary rehab referral sent.   ENT referral sent.   Follow up with cardiology as scheduled.   PFTs are normal today. This could potentially be COVID long hauler syndrome. No signs of asthma, COPD, or restriction on your pulmonary function tests.   Follow up in 3 months with Dr. Silas Flood or APP. If symptoms do not improve or worsen, please contact office for sooner follow up or seek emergency care. p  Perennial allergic rhinitis Persistent rhinitis despite optimal therapy. Saline  nasal spray and rinses advised. Previous workup by allergy/asthma positive for cat/dog dander. ENT referral for further eval. See above plan.     Clayton Bibles, NP 06/01/2021  Pt aware and understands NP's role.

## 2021-06-01 NOTE — Patient Instructions (Addendum)
Continue Advair inhaler 1 puff Twice daily, rinse after Continue albuterol inhaler 1-2 puffs every 6 hours as needed for shortness of breath or wheezing or prior to exercise Continue nasocort nasal spray 1 spray each nostril daily Continue Xyzal 5 mg At bedtime  Continue Singulair 10 mg At bedtime   Saline nasal spray 2-3 times a day  Healthy weight management discussed   CT chest ordered. They will contact you for scheduling. This is to reevaluate the previously identified nodules on your last CT chest.   FeNO today was 11.  Cardiopulmonary rehab referral sent.   ENT referral sent.   Follow up with cardiology as scheduled.   PFTs are normal today. This could potentially be COVID long hauler syndrome. No signs of asthma, COPD, or restriction on your pulmonary function tests.   Follow up in 3 months with Rita Nelson or APP. If symptoms do not improve or worsen, please contact office for sooner follow up or seek emergency care.

## 2021-06-04 ENCOUNTER — Other Ambulatory Visit: Payer: Self-pay

## 2021-06-04 ENCOUNTER — Encounter (HOSPITAL_COMMUNITY): Payer: Self-pay | Admitting: Emergency Medicine

## 2021-06-04 ENCOUNTER — Emergency Department (HOSPITAL_COMMUNITY)
Admission: EM | Admit: 2021-06-04 | Discharge: 2021-06-05 | Disposition: A | Discharge status: Home / Self Care | Payer: BC Managed Care – PPO | Attending: Emergency Medicine | Admitting: Emergency Medicine

## 2021-06-04 DIAGNOSIS — Z87891 Personal history of nicotine dependence: Secondary | ICD-10-CM | POA: Insufficient documentation

## 2021-06-04 DIAGNOSIS — M5442 Lumbago with sciatica, left side: Secondary | ICD-10-CM | POA: Diagnosis not present

## 2021-06-04 DIAGNOSIS — Z8616 Personal history of COVID-19: Secondary | ICD-10-CM | POA: Insufficient documentation

## 2021-06-04 DIAGNOSIS — Z79899 Other long term (current) drug therapy: Secondary | ICD-10-CM | POA: Diagnosis not present

## 2021-06-04 DIAGNOSIS — M545 Low back pain, unspecified: Secondary | ICD-10-CM | POA: Diagnosis present

## 2021-06-04 NOTE — Telephone Encounter (Signed)
Prior authorization for [employer] submitted

## 2021-06-04 NOTE — ED Notes (Signed)
Called patient several times for room, but no answer

## 2021-06-04 NOTE — ED Provider Notes (Signed)
Emergency Medicine Provider Triage Evaluation Note  Rita Nelson , a 40 y.o. female  was evaluated in triage.  Pt complains of lower back pain since yesterday. Patient denies any trauma to area. Patient states she was standing at work when the back pain came on. Patient denies IVDU, bowel/bladder incontinence, groin numbness, LE weakness  Review of Systems  Positive: Back pain Negative: Bowel/bladder incontinence, groin numbness, lower extremity weakness, IVDU  Physical Exam  BP 131/73 (BP Location: Left Arm)   Pulse 100   Temp 98.6 F (37 C) (Oral)   Resp 18   Ht 5\' 5"  (1.651 m)   Wt 113 kg   SpO2 100%   BMI 41.46 kg/m  Gen:   Awake, no distress   Resp:  Normal effort  MSK:   Moves extremities without difficulty.  Other:  Patient has good motor function, strength to LE. Patient has intact sensation.   Medical Decision Making  Medically screening exam initiated at 3:49 PM.  Appropriate orders placed.  Rita Nelson was informed that the remainder of the evaluation will be completed by another provider, this initial triage assessment does not replace that evaluation, and the importance of remaining in the ED until their evaluation is complete.     Selinda Orion, PA-C 06/04/21 1554    14/05/22, MD 06/05/21 1242

## 2021-06-04 NOTE — ED Triage Notes (Signed)
Pt here POV with c/o of back pain. Increased pain on left with radiating pain down leg. Pt states she was at work, possibly twisted wrong. Pt states she is urinating less since.

## 2021-06-05 ENCOUNTER — Emergency Department (HOSPITAL_COMMUNITY): Payer: BC Managed Care – PPO

## 2021-06-05 LAB — PREGNANCY, URINE: Preg Test, Ur: NEGATIVE

## 2021-06-05 MED ORDER — OXYCODONE-ACETAMINOPHEN 5-325 MG PO TABS
2.0000 | ORAL_TABLET | Freq: Once | ORAL | Status: AC
  Administered 2021-06-05: 2 via ORAL
  Filled 2021-06-05: qty 2

## 2021-06-05 MED ORDER — METHOCARBAMOL 500 MG PO TABS: 500.0000 mg | ORAL_TABLET | Freq: Two times a day (BID) | ORAL | 0 refills | Status: AC

## 2021-06-05 MED ORDER — HYDROCODONE-ACETAMINOPHEN 5-325 MG PO TABS: 1.0000 | ORAL_TABLET | ORAL | 0 refills | Status: AC | PRN

## 2021-06-05 MED ORDER — PREDNISONE 20 MG PO TABS: ORAL_TABLET | ORAL | 0 refills | Status: AC

## 2021-06-05 NOTE — ED Notes (Signed)
Patient transported to X-ray 

## 2021-06-05 NOTE — ED Notes (Signed)
Pt wheeled to restroom for urine sample.

## 2021-06-05 NOTE — ED Provider Notes (Signed)
[facility] [place] EMERGENCY DEPARTMENT Provider Note   CSN: [id number] Arrival date & time: 06/04/21  1509     History Chief Complaint  Patient presents with   Back Pain    Rita Nelson is a 40 y.o. female.  The history is provided by the patient and medical records.  Back Pain  40 year old female with history of anxiety, depression, GERD, hyperlipidemia, presenting to the ED with low back pain.  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  She was passing out medication Sunday night on shift when she turned and felt a sharp pain in her back.  Has progressed since that time.  Feels on both sides of low back with some radiation into the left buttock and down left leg.  She does have some tingling but denies any focal numbness or weakness.  She is not had any bowel or bladder incontinence.  She has not had any fevers or chills.  She does report injury to her back many years ago during karate but did not have any imaging or any other follow-up.  States from time to time she gets pain but never this bad.  She has been taking ibuprofen and other over-the-counter medications without relief.  She remains ambulatory with cane which she borrowed from a family member.  She has no history of IV drug use, cancer, or other immune compromise.  Past Medical History:  Diagnosis Date   Anxiety    Depression    Diverticulosis    E. coli infection    GERD (gastroesophageal reflux disease)    Gram-negative infection    Hyperlipidemia    Migraine    Seasonal allergies    Staph infection     Patient Active Problem List   Diagnosis Date Noted   COVID-19 long hauler manifesting chronic dyspnea 06/01/2021   Abnormal stress electrocardiogram test using treadmill    Precordial pain    Perennial allergic rhinitis 05/05/2018    Past Surgical History:  Procedure Laterality Date   ABDOMINAL ADHESION SURGERY  2008   CESAREAN SECTION     x 2   LAPAROSCOPY     TONSILLECTOMY AND  ADENOIDECTOMY     TYMPANOSTOMY TUBE PLACEMENT       OB History   No obstetric history on file.     Family History  Problem Relation Age of Onset   Hypertension Mother    Hypertension Father    Diabetes Father    Hypertension Sister    Ovarian cancer Maternal Grandmother    Ulcerative colitis Maternal Grandmother    Colon polyps Maternal Grandmother    Breast cancer Paternal Grandmother    Heart attack Paternal Grandmother    Diabetes Paternal Grandfather    Alzheimer's disease Paternal Grandfather    Diverticulosis Paternal Grandfather        with rupture   Breast cancer Paternal Aunt    Prostate cancer Other        moms side   Stomach cancer Neg Hx    Colon cancer Neg Hx    Esophageal cancer Neg Hx    Pancreatic cancer Neg Hx     Social History   Tobacco Use   Smoking status: Former    Packs/day: 1.00    Years: 15.00    Pack years: 15.00    Types: Cigarettes    Quit date: 03/03/2014    Years since quitting: 7.2   Smokeless tobacco: Never  Vaping Use   Vaping Use: Some days  Devices: CBD vaping  Substance Use Topics   Alcohol use: Yes    Comment: occ   Drug use: No    Home Medications Prior to Admission medications   Medication Sig Start Date End Date Taking? Authorizing Provider  HYDROcodone-acetaminophen (NORCO/VICODIN) 5-325 MG tablet Take 1 tablet by mouth every 4 (four) hours as needed. 06/05/21  Yes Larene Pickett, PA-C  methocarbamol (ROBAXIN) 500 MG tablet Take 1 tablet (500 mg total) by mouth 2 (two) times daily. 06/05/21  Yes Larene Pickett, PA-C  predniSONE (DELTASONE) 20 MG tablet Take 40 mg by mouth daily for 3 days, then 20mg  by mouth daily for 3 days, then 10mg  daily for 3 days 06/05/21  Yes Larene Pickett, PA-C  albuterol [facility] HFA) 108 (90 Base) MCG/ACT inhaler Inhale 1-2 puffs into the lungs every 6 (six) hours as needed for wheezing or shortness of breath. 05/15/21   Hunsucker, Bonna Gains, MD  aspirin-acetaminophen-caffeine (EXCEDRIN  MIGRAINE) [id number] MG tablet Take 1 tablet by mouth every 6 (six) hours as needed for headache or migraine.    [provider]  Biotin 5000 MCG CAPS Take 1 capsule by mouth daily at 12 noon.    [provider]  buPROPion (WELLBUTRIN XL) 300 MG 24 hr tablet Take 300 mg by mouth every evening.  04/19/18   [provider]  Cranberry-Vitamin C-Vitamin E (CRANBERRY PLUS VITAMIN C PO) Take 2 capsules by mouth daily.    [provider]  diltiazem (CARDIZEM) 30 MG tablet TAKE 1 TABLET(30 MG) BY MOUTH TWICE DAILY 05/07/21   Tolia, Sunit, DO  fluticasone-salmeterol (ADVAIR DISKUS) 500-50 MCG/ACT AEPB Inhale 1 puff into the lungs in the morning and at bedtime. 05/15/21   Hunsucker, Bonna Gains, MD  gemfibrozil (LOPID) 600 MG tablet Take 600 mg by mouth 2 (two) times daily. 12/24/19   [provider]  hydrochlorothiazide (HYDRODIURIL) 12.5 MG tablet Take 12.5 mg by mouth daily. 12/19/20   [provider]  levocetirizine (XYZAL) 5 MG tablet Take 5 mg by mouth every evening.    [provider]  montelukast (SINGULAIR) 10 MG tablet  03/05/18   [provider]  Norethindrone Acetate-Ethinyl Estrad-FE (JUNEL FE 24) 1-20 MG-MCG(24) tablet Take 1 tablet by mouth daily.    [provider]  Omega-3 Fatty Acids (FISH OIL) 1000 MG CAPS Take 1 capsule by mouth every evening.    [provider]  ondansetron (ZOFRAN) 8 MG tablet Take 4 mg by mouth every 8 (eight) hours as needed for nausea or vomiting.    [provider]  OVER THE COUNTER MEDICATION Take 1 capsule by mouth daily as needed. Stacker 3 XPLC    [provider]  OVER THE COUNTER MEDICATION Take 10 mg by mouth daily as needed. Sudaphed    [provider]  pantoprazole (PROTONIX) 40 MG tablet Take 40 mg by mouth daily.    [provider]  Plecanatide (TRULANCE) 3 MG TABS Take 1 tablet by mouth daily. 06/01/21   Irene Shipper, MD  Rimegepant  Sulfate (NURTEC) 75 MG TBDP Take 75 mg by mouth as needed.    [provider]  rOPINIRole (REQUIP) 1 MG tablet Take 1 mg by mouth daily.  12/24/19   [provider]  sertraline (ZOLOFT) 50 MG tablet Take 50 mg by mouth daily.    [provider]  topiramate (TOPAMAX) 100 MG tablet Take 100 mg by mouth daily. 12/24/19   [provider]  triamcinolone (NASACORT)  55 MCG/ACT AERO nasal inhaler Place 1 spray into the nose daily. 06/01/21   Cobb, Karie Schwalbe, NP  omeprazole (PRILOSEC) 20 MG capsule Take 20 mg by mouth 2 (two) times daily as needed (heartburn).  03/05/18 01/26/20  [provider]    Allergies    Sulfa antibiotics and Sulfamethoxazole-trimethoprim  Review of Systems   Review of Systems  Musculoskeletal:  Positive for back pain.  All other systems reviewed and are negative.  Physical Exam Updated Vital Signs BP 119/67   Pulse 83   Temp 97.8 F (36.6 C) (Temporal)   Resp 16   Ht 5\' 5"  (1.651 m)   Wt 113 kg   SpO2 97%   BMI 41.46 kg/m   Physical Exam Vitals and nursing note reviewed.  Constitutional:      Appearance: She is well-developed.  HENT:     Head: Normocephalic and atraumatic.  Eyes:     Conjunctiva/sclera: Conjunctivae normal.     Pupils: Pupils are equal, round, and reactive to light.  Cardiovascular:     Rate and Rhythm: Normal rate and regular rhythm.     Heart sounds: Normal heart sounds.  Pulmonary:     Effort: Pulmonary effort is normal.     Breath sounds: Normal breath sounds.  Abdominal:     General: Bowel sounds are normal.     Palpations: Abdomen is soft.  Musculoskeletal:        General: Normal range of motion.     Cervical back: Normal range of motion.     Comments: Lumbar spine without noted deformity, does have tenderness  bilaterally, no bruising or acute signs of trauma, normal motor function of BLE, + SLR on left, DP pulses intact BLE, normal sensation throughout  Skin:    General: Skin is warm  and dry.  Neurological:     Mental Status: She is alert and oriented to person, place, and time.    ED Results / Procedures / Treatments   Labs (all labs ordered are listed, but only abnormal results are displayed) Labs Reviewed  PREGNANCY, URINE    EKG None  Radiology DG Lumbar Spine Complete  Result Date: 06/05/2021 CLINICAL DATA:  Back pain EXAM: LUMBAR SPINE - COMPLETE 4+ VIEW COMPARISON:  None. FINDINGS: Mild straightening of the lumbar spine. No acute fracture or listhesis. Intervertebral disc space narrowing and endplate remodeling at 075-GRM is in keeping with mild degenerative disc disease. Remaining intervertebral disc heights are preserved. Vertebral body heights are preserved. Oblique views demonstrate no evidence of pars defect. The paraspinal soft tissues are unremarkable. IMPRESSION: Mild degenerative disc disease L4-5. Electronically Signed   By: Fidela Salisbury M.D.   On: 06/05/2021 02:56    Procedures Procedures   Medications Ordered in ED Medications  oxyCODONE-acetaminophen (PERCOCET/ROXICET) 5-325 MG per tablet 2 tablet (2 tablets Oral Given 06/05/21 0118)    ED Course  I have reviewed the triage vital signs and the nursing notes.  Pertinent labs & imaging results that were available during my care of the patient were reviewed by me and considered in my medical decision making (see chart for details).    MDM Rules/Calculators/A&P                           40 year old female presenting to the ED with low back pain.  States she may have twisted wrong at work.  Pain all across low back and radiating down left leg.  She is  not had any focal numbness or weakness.  No bowel or bladder incontinence.  Exam with some tenderness along the low back, positive straight leg raise on left.  Her legs are neurovascular intact and she has normal motor function and sensation.  X-ray is negative for any acute findings, does have some degenerative changes at L4/L5.  She has had  improvement in symptoms with oral Percocet, she is now sitting fully upright and onto the left side.  She remains without any focal neurologic deficits suggestive of cauda equina.  Feel she is stable for discharge home.  Will give trial of symptomatic management but will refer to neurosurgery if ongoing issues-- may ultimately need MRI if so.  She is given work note.  Can follow-up with PCP in the interim.  Return here for new concerns.  Final Clinical Impression(s) / ED Diagnoses Final diagnoses:  Acute bilateral low back pain with left-sided sciatica    Rx / DC Orders ED Discharge Orders          Ordered    HYDROcodone-acetaminophen (NORCO/VICODIN) 5-325 MG tablet  Every 4 hours PRN        06/05/21 0333    methocarbamol (ROBAXIN) 500 MG tablet  2 times daily        06/05/21 0333    predniSONE (DELTASONE) 20 MG tablet        06/05/21 0333             Garlon Hatchet, PA-C 06/05/21 0451    Shon Baton, MD 06/05/21 [phone]

## 2021-06-05 NOTE — Discharge Instructions (Signed)
Take the prescribed medication as directed. Follow-up with neurosurgery if having ongoing issues-- call for appt. Return to the ED for new or worsening symptoms-- bowel or bladder incontinence, foot drop, etc.

## 2021-06-07 ENCOUNTER — Telehealth (HOSPITAL_COMMUNITY): Payer: Self-pay

## 2021-06-07 NOTE — Telephone Encounter (Signed)
Pt is unable to do the pulmonary rehab program. Advised pt if anything changes to give [place] a call. Closed referral

## 2021-06-08 NOTE — Telephone Encounter (Signed)
Goodness...  Miralax daily. Adjust as needed. Thanks

## 2021-06-08 NOTE — Telephone Encounter (Signed)
Informed patient the Trulance was denied and she has to fail two different laxatives with her insurance. Patient states she has tried all of the over the counter laxatives and stimulants. Patient states she does not understand why the medication was denied because she has several co-workers that are on [employer] and have the same insurance coverage as her. Informed patient that I unfortunately did not initiate the PA but I am sure we can re-submit it to her insurance company with that information. Informed patient that I will leave some samples at the front desk to start while we re-submit the PA. Patient agreed. Patient states she also has aggravated a hemorrhoid due to straining and wondered what she can do. Patient states she has tried OTC preparation H cream. Informed patient to try OTC preparation H suppositories and coat them in hydrocortisone 1 % cream and insert into rectum. Informed patient to contact the office back if this does not help her hemorrhoid symptoms. Patient verbalized understanding. Informed patient we will contact her once we here back from her insurance company.

## 2021-06-08 NOTE — Telephone Encounter (Signed)
Received denial for Trulance from [employer]. Patient has to try two different standard laxative therapy classes such as Miralax, stool softener such as docusate sodium, stimulant such as bisacodyl. Also, have documented intolerance, allergy or other medical reason that would prevent you from taking all standard laxative therapy classes. Please advise Dr. Marina Goodell what you would like patient to take in its place.

## 2021-06-13 ENCOUNTER — Other Ambulatory Visit: Payer: BC Managed Care – PPO

## 2021-06-13 NOTE — Telephone Encounter (Signed)
Spoke to insurance company/pharmacy to find out what I needed to do to get Trulance approved.  I faxed in the form with the requested information.  Awaiting final response

## 2021-06-14 ENCOUNTER — Ambulatory Visit (INDEPENDENT_AMBULATORY_CARE_PROVIDER_SITE_OTHER)
Admission: RE | Admit: 2021-06-14 | Discharge: 2021-06-14 | Disposition: A | Discharge status: Home / Self Care | Payer: BC Managed Care – PPO | Source: Ambulatory Visit | Attending: Nurse Practitioner | Admitting: Nurse Practitioner

## 2021-06-14 ENCOUNTER — Other Ambulatory Visit: Payer: Self-pay

## 2021-06-14 DIAGNOSIS — R918 Other nonspecific abnormal finding of lung field: Secondary | ICD-10-CM

## 2021-06-15 MED ORDER — TRULANCE 3 MG PO TABS: 1.0000 | ORAL_TABLET | Freq: Every day | ORAL | 3 refills | Status: AC

## 2021-06-15 NOTE — Addendum Note (Signed)
Addended by: Jeanine Luz on: 06/15/2021 02:47 PM   Modules accepted: Orders

## 2021-06-15 NOTE — Telephone Encounter (Signed)
Trulance was finally approved - resent rx to pharmacy with approval information.  Sent patient a mychart message to let her know.

## 2021-06-18 ENCOUNTER — Telehealth: Payer: Self-pay

## 2021-06-18 NOTE — Progress Notes (Signed)
CT chest was normal and did not show any signs of acute or chronic abnormalities. Both lungs clear. Previously noted infiltrates (on CT when she had COVID) have resolved and are no longer seen. Thanks!

## 2021-06-18 NOTE — Telephone Encounter (Signed)
-----   Message from Micheline Maze V, NP sent at 06/18/2021 10:48 AM EST ----- CT chest was normal and did not show any signs of acute or chronic abnormalities. Both lungs clear. Previously noted infiltrates (on CT when she had COVID) have resolved and are no longer seen. Thanks!

## 2021-06-19 ENCOUNTER — Telehealth: Payer: Self-pay

## 2021-06-19 NOTE — Telephone Encounter (Signed)
Phone call recorded °

## 2021-06-19 NOTE — Telephone Encounter (Signed)
-----   Message from Micheline Maze V, NP sent at 06/18/2021 10:48 AM EST ----- CT chest was normal and did not show any signs of acute or chronic abnormalities. Both lungs clear. Previously noted infiltrates (on CT when she had COVID) have resolved and are no longer seen. Thanks!

## 2021-07-06 ENCOUNTER — Telehealth: Payer: Self-pay | Admitting: Nurse Practitioner

## 2021-07-09 NOTE — Telephone Encounter (Signed)
Note ----- Message from Micheline Maze V, NP sent at 06/18/2021 10:48 AM EST ----- CT chest was normal and did not show any signs of acute or chronic abnormalities. Both lungs clear. Previously noted infiltrates (on CT when she had COVID) have resolved and are no longer seen. Thanks!    Spoke with pt and notified of results per Dr. Florentina Addison. Pt verbalized understanding and denied any questions.

## 2021-08-02 ENCOUNTER — Ambulatory Visit: Payer: BC Managed Care – PPO | Admitting: Cardiology

## 2023-03-06 IMAGING — CT CT CHEST W/O CM
2 of 4 series · 15 of 36 positions shown, 18 images · non-contrast
Comparison: 04/10/2021, 06/06/2019

CLINICAL DATA: Lung nodule, > 8mm. Dyspnea on exertion. Recurrent
COVID pneumonia.

EXAM:
CT CHEST WITHOUT CONTRAST
TECHNIQUE: Multidetector CT imaging of the chest was performed following the
standard protocol without IV contrast.

[Series 2: thorax · axial · 0.75mm/px · z∈[-384,-130]mm · 12 of 151 slices shown, 15 images]
[im 12/151  mediastinal]
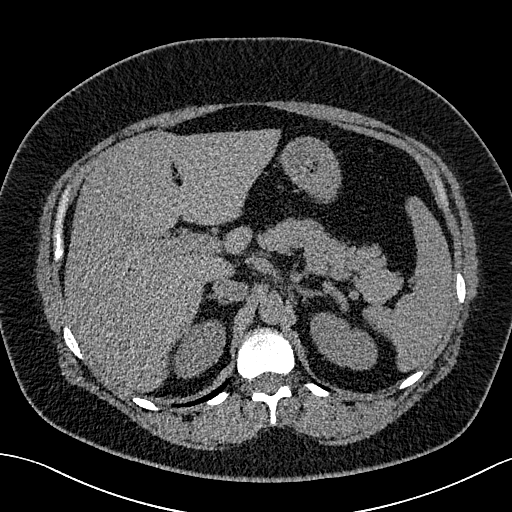
[im 12/151  lung]
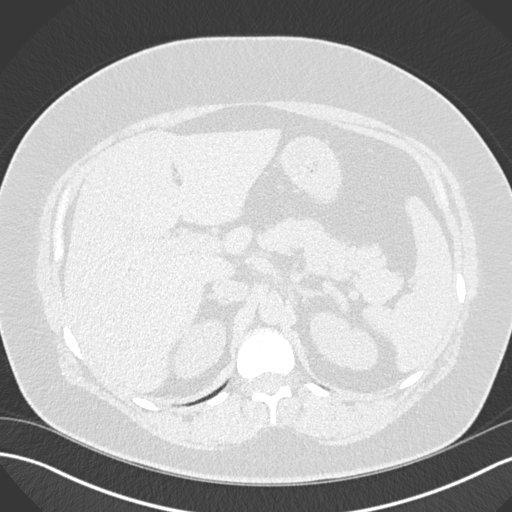
[im 24/151  lung]
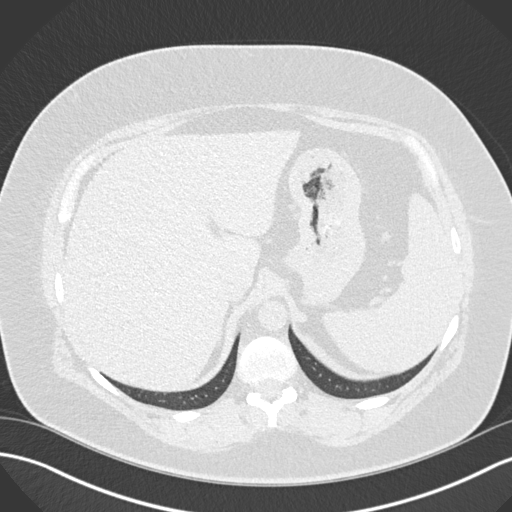
[im 35/151  lung]
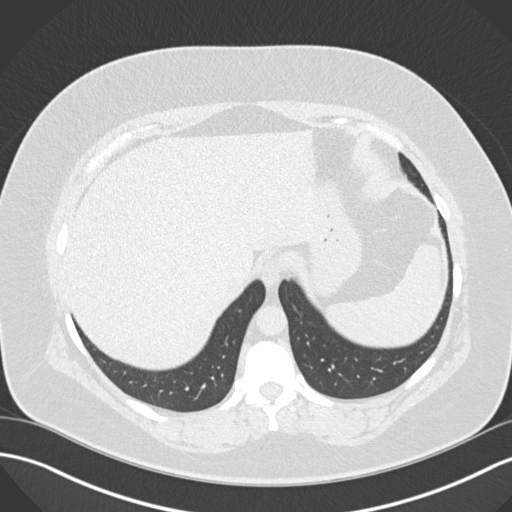
[im 47/151  lung]
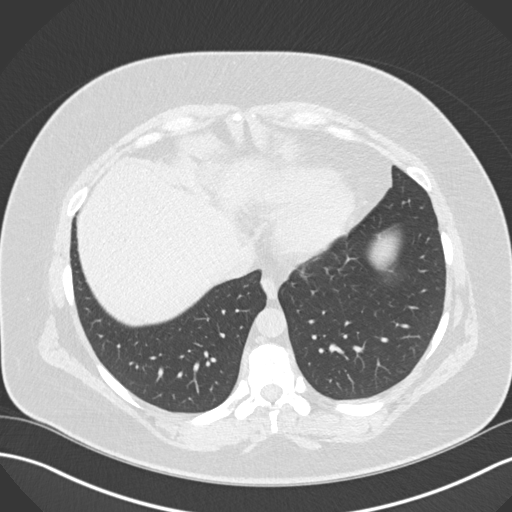
[im 58/151  mediastinal]
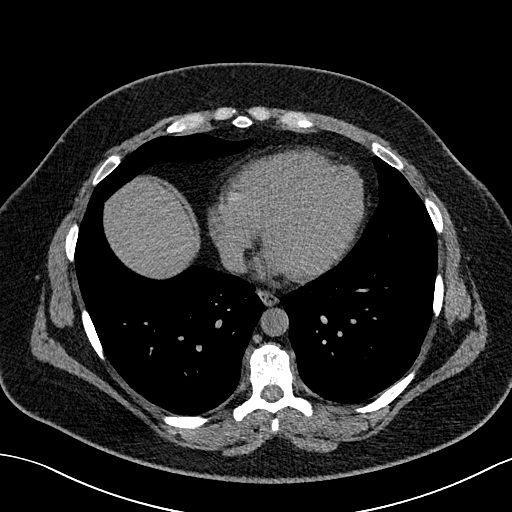
[im 58/151  lung]
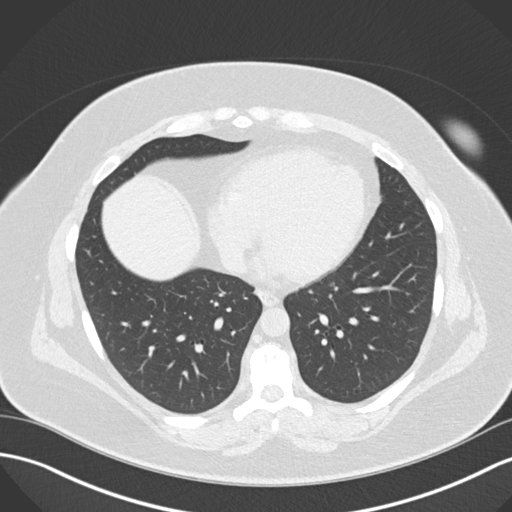
[im 70/151  lung]
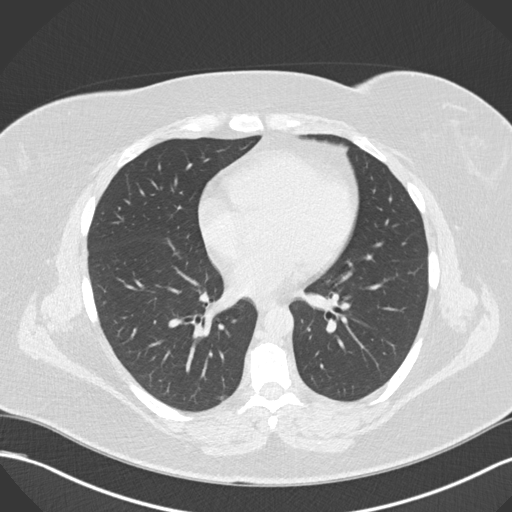
[im 81/151  lung]
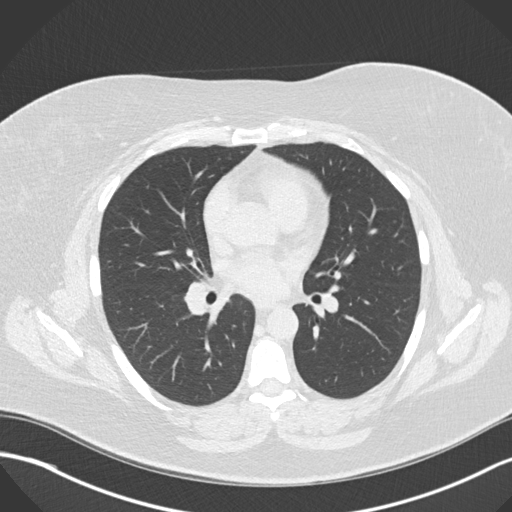
[im 93/151  lung]
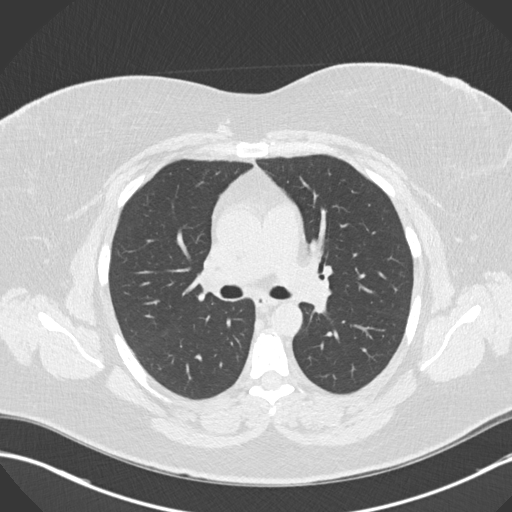
[im 104/151  mediastinal]
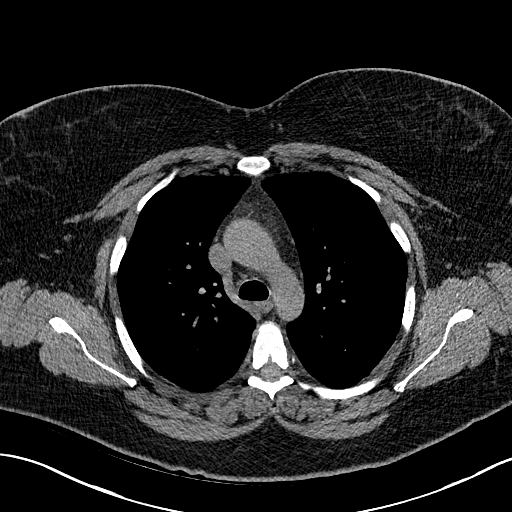
[im 104/151  lung]
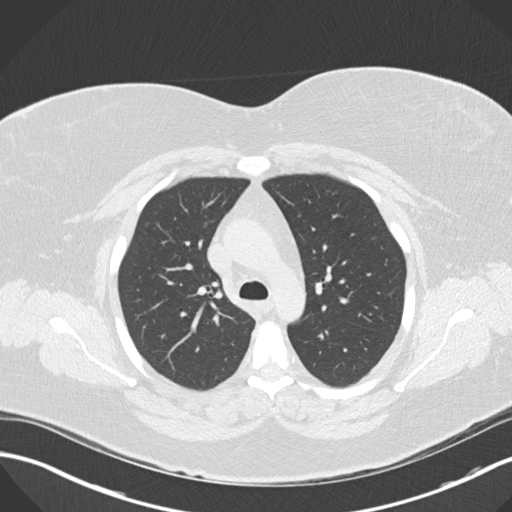
[im 116/151  lung]
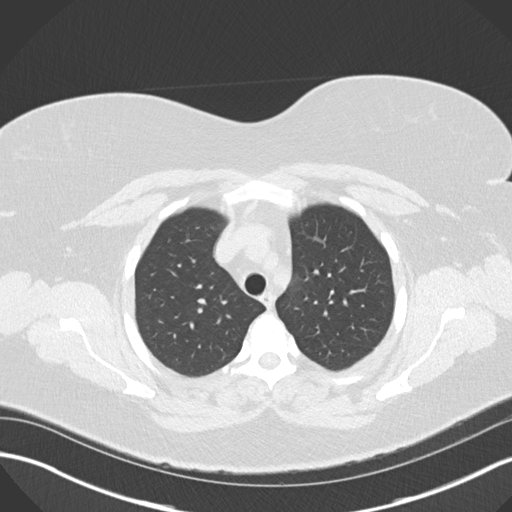
[im 127/151  lung]
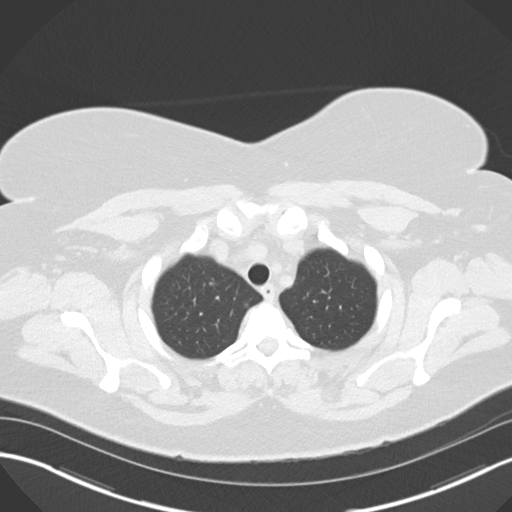
[im 139/151  lung]
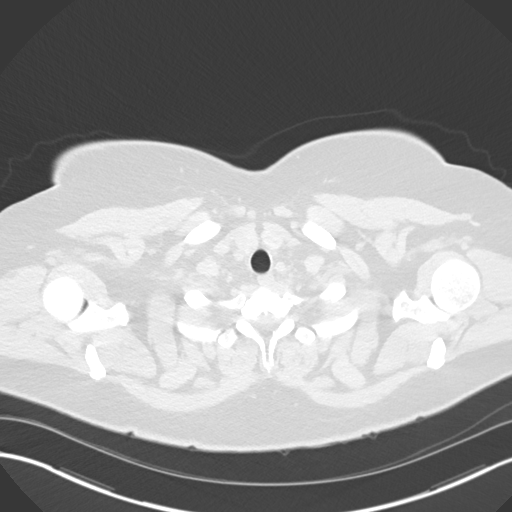

[Series 5: coronal · coronal · 0.59mm/px · 3 of 131 slices shown]
[im 27/131  lung]
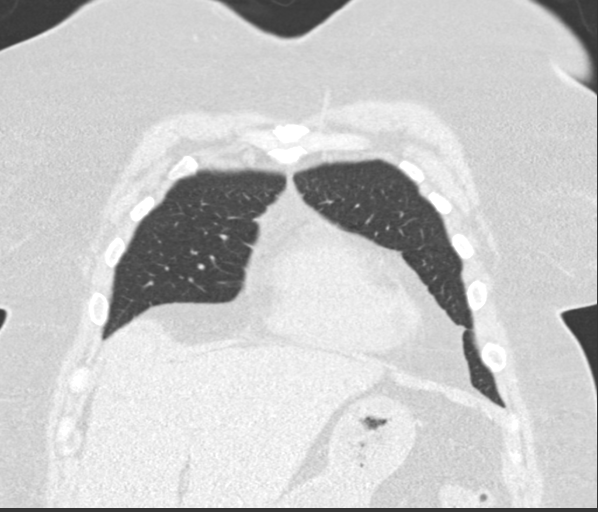
[im 53/131  lung]
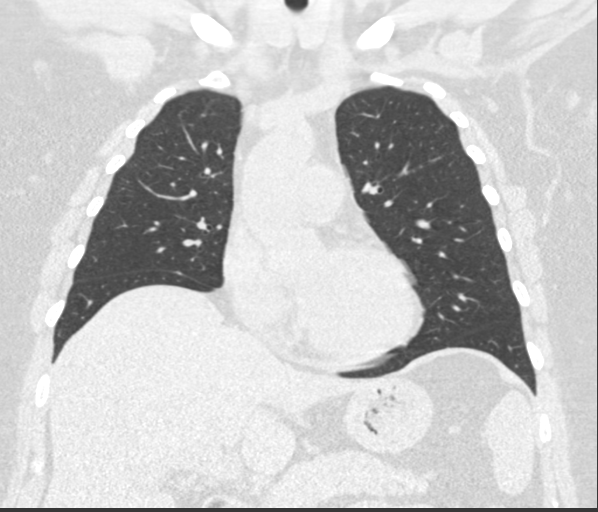
[im 79/131  lung]
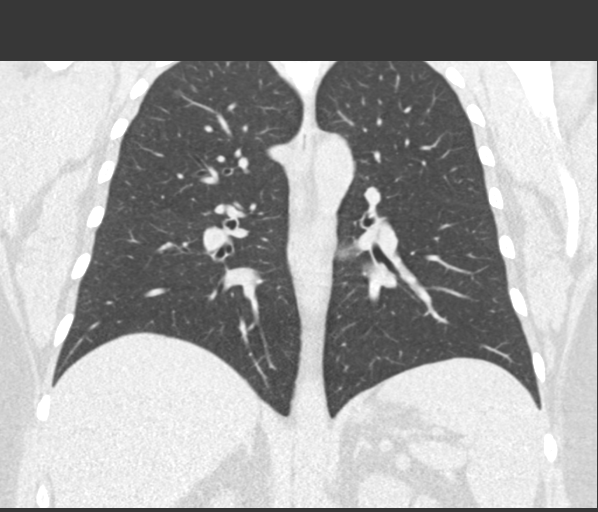

[15 of 36 positions shown; findings below may reference images not displayed]

FINDINGS: Cardiovascular: No significant vascular findings. Normal heart size.
No pericardial effusion.

Mediastinum/Nodes: No enlarged mediastinal or axillary lymph nodes.
Thyroid gland, trachea, and esophagus demonstrate no significant
findings.

Lungs/Pleura: Lungs are clear. Previously noted pulmonary
infiltrates have resolved. No pleural effusion or pneumothorax.

Upper Abdomen: No acute abnormality.

Musculoskeletal: No chest wall mass or suspicious bone lesions
identified.
IMPRESSION: No acute intrathoracic pathology identified. No definite
radiographic explanation for the patient's reported symptoms.

Resolved multifocal pulmonary infiltrates noted on prior examination
compatible with a infectious or inflammatory process at that time.
